# Patient Record
Sex: Male | Born: 1960 | Race: White | Hispanic: No | Marital: Married | Smoking: Never smoker
Health system: Southern US, Community
[De-identification: ages and names within clinical notes are randomized; demographics above are authoritative.]

## PROBLEM LIST (undated history)

## (undated) DIAGNOSIS — T884XXA Failed or difficult intubation, initial encounter: Secondary | ICD-10-CM

## (undated) DIAGNOSIS — L409 Psoriasis, unspecified: Secondary | ICD-10-CM

## (undated) DIAGNOSIS — D509 Iron deficiency anemia, unspecified: Secondary | ICD-10-CM

## (undated) DIAGNOSIS — R079 Chest pain, unspecified: Secondary | ICD-10-CM

## (undated) DIAGNOSIS — I10 Essential (primary) hypertension: Secondary | ICD-10-CM

## (undated) DIAGNOSIS — F1020 Alcohol dependence, uncomplicated: Secondary | ICD-10-CM

## (undated) DIAGNOSIS — M722 Plantar fascial fibromatosis: Secondary | ICD-10-CM

## (undated) DIAGNOSIS — F419 Anxiety disorder, unspecified: Secondary | ICD-10-CM

## (undated) DIAGNOSIS — K219 Gastro-esophageal reflux disease without esophagitis: Secondary | ICD-10-CM

## (undated) DIAGNOSIS — G4700 Insomnia, unspecified: Secondary | ICD-10-CM

## (undated) HISTORY — DX: Anxiety disorder, unspecified: F41.9

## (undated) HISTORY — DX: Insomnia, unspecified: G47.00

## (undated) HISTORY — PX: HEMORRHOID SURGERY: SHX153

## (undated) HISTORY — DX: Iron deficiency anemia, unspecified: D50.9

## (undated) HISTORY — DX: Plantar fascial fibromatosis: M72.2

## (undated) HISTORY — DX: Chest pain, unspecified: R07.9

## (undated) HISTORY — DX: Psoriasis, unspecified: L40.9

## (undated) HISTORY — DX: Alcohol dependence, uncomplicated: F10.20

## (undated) HISTORY — PX: OTHER SURGICAL HISTORY: SHX169

---

## 2001-07-04 ENCOUNTER — Ambulatory Visit (HOSPITAL_COMMUNITY): Admission: RE | Admit: 2001-07-04 | Discharge: 2001-07-04 | Payer: Self-pay | Admitting: Internal Medicine

## 2001-12-24 ENCOUNTER — Encounter: Payer: Self-pay | Admitting: Emergency Medicine

## 2001-12-24 ENCOUNTER — Emergency Department (HOSPITAL_COMMUNITY): Admission: EM | Admit: 2001-12-24 | Discharge: 2001-12-24 | Payer: Self-pay | Admitting: Emergency Medicine

## 2001-12-26 ENCOUNTER — Encounter: Payer: Self-pay | Admitting: Emergency Medicine

## 2001-12-26 ENCOUNTER — Ambulatory Visit (HOSPITAL_COMMUNITY): Admission: RE | Admit: 2001-12-26 | Discharge: 2001-12-26 | Payer: Self-pay | Admitting: Emergency Medicine

## 2002-11-16 HISTORY — PX: ESOPHAGOGASTRODUODENOSCOPY: SHX1529

## 2003-09-19 ENCOUNTER — Ambulatory Visit (HOSPITAL_COMMUNITY): Admission: RE | Admit: 2003-09-19 | Discharge: 2003-09-19 | Payer: Self-pay | Admitting: Internal Medicine

## 2010-06-06 ENCOUNTER — Ambulatory Visit (HOSPITAL_COMMUNITY): Admission: RE | Admit: 2010-06-06 | Discharge: 2010-06-06 | Payer: Self-pay | Admitting: Family Medicine

## 2010-06-10 ENCOUNTER — Ambulatory Visit (HOSPITAL_COMMUNITY): Admission: RE | Admit: 2010-06-10 | Discharge: 2010-06-10 | Payer: Self-pay | Admitting: Family Medicine

## 2011-04-03 NOTE — Op Note (Signed)
NAME:  Troy [name], Troy [name]                          ACCOUNT NO.:  [id number]   MEDICAL RECORD NO.:  [id number]                   PATIENT TYPE:  AMB   LOCATION:  DAY                                  FACILITY:  [facility]   PHYSICIAN:  Lionel December, M.D.                 DATE OF BIRTH:  [birth year]   DATE OF PROCEDURE:  09/19/2003  DATE OF DISCHARGE:  09/19/2003                                 OPERATIVE REPORT   PROCEDURE:  Esophagogastroduodenoscopy with esophageal dilatation.   ENDOSCOPIST:  Lionel December, M.D.   INDICATIONS:  This patient is a 50 year old Caucasian male with a 1-year  history of intermittent dysphagia primarily to solids and occasional  heartburn who is undergoing diagnostic/therapeutic procedure.  The procedure  and risks were reviewed with the patient and informed consent was obtained.   PREOPERATIVE MEDICATIONS:  Cetacaine spray for oropharyngeal topical  anesthesia, Demerol 50 mg IV and Versed 8 mg IV in divided dose.   FINDINGS:  Procedure performed in endoscopy suite.  The patient's vital  signs and O2 saturation were monitored during the procedure and remained  stable.  The patient was placed in the left lateral recumbent position and  Olympus videoscope was passed via the oropharynx without any difficulty into  the esophagus.   ESOPHAGUS:  Mucosa of the esophagus was normal; however, he had a soft  stricture at the esophagogastric junction with a single erosion.  There was  no hernia present.   STOMACH:  It was empty and distended very well with insufflation.  The folds  of the proximal stomach were normal.  Examination of the mucosa revealed  erosions at antrum.  Angularis, fundus, and cardia were examined by  retroflexing the scope and were normal.   DUODENUM:  Examination of the bulb and second part of the duodenum was  normal.   Endoscope was withdrawn  The esophagus was dilated by passing 56 [place]  Maloney dilator which resulted in a small linear tear at  the GE junction as  evidenced by passing the endoscope again.  The patient tolerated the  procedure well.   FINAL DIAGNOSES:  1. 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  The     stricture was dilated to 85 [place] by passing a dilator.  2. Erosive antral gastritis.   RECOMMENDATIONS:  1. Antireflux measures.  We will start him on omeprazole 20 mg p.o. q.a.m.  2. H. pylori will be checked today.      ___________________________________________                                            Lionel December, M.D.   NR/MEDQ  D:  10/03/2003  T:  10/04/2003  Job:  [id number]   cc:   Mila Homer. Sudie Bailey, M.D.  [phone]  [address]  [place], [place] [address]  Fax: [phone]

## 2012-11-16 DEATH — deceased

## 2014-06-01 ENCOUNTER — Encounter (HOSPITAL_COMMUNITY): Payer: Self-pay

## 2014-06-01 NOTE — Patient Instructions (Signed)
Troy [name]  06/01/2014   Your procedure is scheduled on:  06/07/2014  Report to [facility] at 8:00 AM.  Call this number if you have problems the morning of surgery: [phone]   Remember:   Do not eat food or drink liquids after midnight.   Take these medicines the morning of surgery with A SIP OF WATER: Lisinopril   Do not wear jewelry, make-up or nail polish.  Do not wear lotions, powders, or perfumes. You may wear deodorant.  Do not shave 48 hours prior to surgery. Men may shave face and neck.  Do not bring valuables to the hospital.  [facility] is not responsible for any belongings or valuables.               Contacts, dentures or bridgework may not be worn into surgery.  Leave suitcase in the car. After surgery it may be brought to your room.  For patients admitted to the hospital, discharge time is determined by your treatment team.               Patients discharged the day of surgery will not be allowed to drive home.  Name and phone number of your driver:   Special Instructions: Shower using CHG 2 nights before surgery and the night before surgery.  If you shower the day of surgery use CHG.  Use special wash - you have one bottle of CHG for all showers.  You should use approximately 1/3 of the bottle for each shower.   Please read over the following fact sheets that you were given: Surgical Site Infection Prevention and Anesthesia Post-op Instructions   PATIENT INSTRUCTIONS POST-ANESTHESIA  IMMEDIATELY FOLLOWING SURGERY:  Do not drive or operate machinery for the first twenty four hours after surgery.  Do not make any important decisions for twenty four hours after surgery or while taking narcotic pain medications or sedatives.  If you develop intractable nausea and vomiting or a severe headache please notify your doctor immediately.  FOLLOW-UP:  Please make an appointment with your surgeon as instructed. You do not need to follow up with anesthesia unless specifically  instructed to do so.  WOUND CARE INSTRUCTIONS (if applicable):  Keep a dry clean dressing on the anesthesia/puncture wound site if there is drainage.  Once the wound has quit draining you may leave it open to air.  Generally you should leave the bandage intact for twenty four hours unless there is drainage.  If the epidural site drains for more than 36-48 hours please call the anesthesia department.  QUESTIONS?:  Please feel free to call your physician or the hospital operator if you have any questions, and they will be happy to assist you.      Laparoscopic Ventral Hernia Repair Laparoscopic ventral hernia repairis a surgery to fix a ventral hernia. Aventral hernia, also called an incisional hernia, is a bulge of body tissue or intestines that pushes through the front part of the abdomen. This can happen if the connective tissue covering the muscles over the abdomen has a weak spot or is torn because of a surgical cut (incision) from a previous surgery. Laparoscopic ventral hernia repair is often done soon after diagnosis to stop the hernia from getting bigger, becoming uncomfortable, or becoming an emergency. This surgery usually takes about 2 hours, but the time can vary greatly. LET [facility] CARE PROVIDER KNOW ABOUT:  Any allergies you have.  All medicines you are taking, including steroids, vitamins, herbs, eye drops,  creams, and over-the-counter medicines.  Previous problems you or members of your family have had with the use of anesthetics.  Any blood disorders you have.  Previous surgeries you have had.  Medical conditions you have. RISKS AND COMPLICATIONS  Generally, laparoscopic ventral hernia repair is a safe procedure. However, as with any surgical procedure, problems can occur. Possible problems include:  Bleeding.  Trouble passing urine or having a bowel movement after the surgery.  Infection.  Pneumonia.  Blood clots.  Pain in the area of the hernia.  A bulge  in the area of the hernia that may be caused by a collection of fluid.  Injury to intestines or other structures in the abdomen.  Return of the hernia after surgery. In some cases, your health care provider may need to stop the laparoscopic procedure and do regular, open surgery. This may be necessary for very difficult hernias, when organs are hard to see, or when bleeding problems occur during surgery. BEFORE THE PROCEDURE   You may need to have blood tests, urine tests, a chest X-ray, or an electrocardiogram done before the day of the surgery.  Ask your health care provider about changing or stopping your regular medicines. This is especially important if you are taking diabetes medicines or blood thinners.  You may need to wash with a special type of germ-killing soap.  Do not eat or drink anything after midnight the night before the procedure or as directed by your health care provider.  Make plans to have someone drive you home after the procedure. PROCEDURE   Small monitors will be put on your body. They are used to check your heart, blood pressure, and oxygen level.  An IV access tube will be put into a vein in your hand or arm. Fluids and medicine will flow directly into your body through the IV tube.  You will be given medicine that makes you go to sleep (general anesthetic).  Your abdomen will be cleaned with a special soap to kill any germs on your skin.  Once you are asleep, several small incisions will be made in your abdomen.  The large space in your abdomen will be filled with air so that it expands. This gives your health care provider more room and a better view.  A thin, lighted tube with a tiny camera on the end (laparoscope) is put through a small incision in your abdomen. The camera on the laparoscope sends a picture to a TV screen in the operating room. This gives your health care provider a good view inside your abdomen.  Hollow tubes are put through the other  small incisions in your abdomen. The tools needed for the procedure are put through these tubes.  Your health care provider puts the tissue or intestines that formed the hernia back in place.  A screen-like patch (mesh) is used to close the hernia. This helps make the area stronger. Stitches, tacks, or staples are used to keep the mesh in place.  Medicine and a bandage (dressing) or skin glue will be put over the incisions. AFTER THE PROCEDURE   You will stay in a recovery area until the anesthetic wears off. Your blood pressure and pulse will be checked often.  You may be able to go home the same day or may need to stay in the hospital for 1-2 days after surgery. Your health care provider will decide when you can go home.  You may feel some pain. You may be given medicine  for pain.  You will be urged to do breathing exercises that involve taking deep breaths. This helps prevent a lung infection after a surgery.  You may have to wear compression stockings while you are in the hospital. These stockings help keep blood clots from forming in your legs. Document Released: 10/19/2012 Document Revised: 11/07/2013 Document Reviewed: 10/19/2012 [facility] Patient Information 2015 [employer], [place]. This information is not intended to replace advice given to you by your health care provider. Make sure you discuss any questions you have with your health care provider.

## 2014-06-02 NOTE — Consult Note (Signed)
NAMBenjamine [name]:  [name], Troy [name]                ACCOUNT NO.:  [id number]  MEDICAL RECORD NO.:  [id number]  LOCATION:  PERIO                         FACILITY:  [facility]  PHYSICIAN:  Troy BarthelWilliam Kendyl [name], M.D. DATE OF BIRTH:  1961-08-10  DATE OF CONSULTATION:  06/01/2014 DATE OF DISCHARGE:                                CONSULTATION   NOTE:  Surgery was asked to see this 53 year old white male for an umbilical hernia.  He has had this for approximately 2 months.  This has caused him increasing discomfort.  He was referred from Dr. Michelle NasutiKnowlton's office.  Clinically, this is a reducible moderate-sized umbilical hernia.  PAST MEDICAL HISTORY:  Positive for hypertension.  PAST SURGICAL HISTORY:  He has had right ankle surgery about 35 years ago.  MEDICATIONS:  See medication list.  ALLERGIES:  He has no known allergies.  SOCIAL HISTORY:  He is a nonsmoker.  He does socially imbibe alcohol.  PHYSICAL EXAMINATION:  GENERAL:  He is in no acute distress. VITAL SIGNS:  He is 6 feet 2 inches, weighs 213 pounds, temperature is 97, pulse is 80 and regular, respirations 12, blood pressure 140/100. HEENT:  Head is normocephalic.  Eyes, extraocular movements are intact. Pupils are round, and reactive to light and accommodation.  There was no conjunctival pallor or scleral injection.  The sclera has a normal tincture.  Nose and oral mucosa are moist.  There is no jugular vein distention, thyromegaly, or cervical adenopathy. CHEST:  Clear both anterior and posterior auscultation. HEART:  Regular rhythm. ABDOMEN:  Soft.  The patient has umbilical hernia that is reducible. RECTAL:  An anoscopy was done.  He has internal hemorrhoids and some skin tags that he was worried might be external hemorrhoid but in fact are skin tags and no surgeries advised on these.  We will plan an Ultroid treatment on his internal hemorrhoids as office procedure at a later time.  His prostate is enlarged but smoothed. EXTREMITIES:  Within  normal limits.  REVIEW OF SYSTEMS:  NEURO:  No migraine, seizures or lateralizing neurological findings.  ENDOCRINE:  No history of diabetes, thyroid disease, or adrenal problems.  CARDIOPULMONARY:  The  patient has a history of hypertension.  MUSCULOSKELETAL:  Grossly within normal limits except in his 20s he had a right ankle surgery.  GI:  No past history of hepatitis.  No symptoms of diarrhea or bright red rectal bleeding, although at times he has had some bleeding from his hemorrhoids.  He does have recurrent bouts sometimes of constipation.  No history of black tarry stools.  No history of irritable bowel syndrome or inflammatory bowel disease, no unexplained weight loss.  The patient had a colonoscopy in 2007.  GU:  No history of frequency, dysuria, or history of kidney stones.  Review of history and physical therefore [name] is a 53 year old white male who has an umbilical hernia which we will repair electively. We discussed the surgery in detail discussing complications, not limited to, but including bleeding, infection, and recurrence, and the possibility that a mesh prosthesis might be utilized.  Informed consent was obtained.  We also discussed his desire to have his internal hemorrhoids taken care of and  this I assured him could be done as an office procedure and I gave him some medical treatment for these in the interim.  We discussed surgery in detail and informed consent was obtained.     Troy [name], M.D.     WB/MEDQ  D:  06/01/2014  T:  06/02/2014  Job:  [id number]  cc:   Troy [name]. Troy [name], M.D. Fax: [phone]

## 2014-06-04 ENCOUNTER — Encounter (HOSPITAL_COMMUNITY): Payer: Self-pay | Admitting: Pharmacy Technician

## 2014-06-04 ENCOUNTER — Other Ambulatory Visit: Payer: Self-pay

## 2014-06-04 ENCOUNTER — Encounter (HOSPITAL_COMMUNITY): Payer: Self-pay

## 2014-06-04 ENCOUNTER — Encounter (HOSPITAL_COMMUNITY)
Admission: RE | Admit: 2014-06-04 | Discharge: 2014-06-04 | Disposition: A | Discharge status: Home / Self Care | Payer: 59 | Source: Ambulatory Visit | Attending: General Surgery | Admitting: General Surgery

## 2014-06-04 DIAGNOSIS — K420 Umbilical hernia with obstruction, without gangrene: Secondary | ICD-10-CM | POA: Diagnosis present

## 2014-06-04 DIAGNOSIS — K219 Gastro-esophageal reflux disease without esophagitis: Secondary | ICD-10-CM | POA: Diagnosis not present

## 2014-06-04 DIAGNOSIS — Z01812 Encounter for preprocedural laboratory examination: Secondary | ICD-10-CM | POA: Diagnosis not present

## 2014-06-04 DIAGNOSIS — Z01810 Encounter for preprocedural cardiovascular examination: Secondary | ICD-10-CM | POA: Diagnosis not present

## 2014-06-04 DIAGNOSIS — I10 Essential (primary) hypertension: Secondary | ICD-10-CM | POA: Diagnosis not present

## 2014-06-04 HISTORY — DX: Essential (primary) hypertension: I10

## 2014-06-04 HISTORY — DX: Gastro-esophageal reflux disease without esophagitis: K21.9

## 2014-06-04 LAB — BASIC METABOLIC PANEL
Anion gap: 13 (ref 5–15)
BUN: 16 mg/dL (ref 6–23)
CHLORIDE: 99 meq/L (ref 96–112)
CO2: 28 meq/L (ref 19–32)
Calcium: 10.2 mg/dL (ref 8.4–10.5)
Creatinine, Ser: 0.97 mg/dL (ref 0.50–1.35)
GFR calc Af Amer: >90 mL/min (ref >90)
GFR calc non Af Amer: >90 mL/min (ref >90)
Glucose, Bld: 90 mg/dL (ref 70–99)
POTASSIUM: 4.9 meq/L (ref 3.7–5.3)
Sodium: 140 mEq/L (ref 137–147)

## 2014-06-04 LAB — CBC
HCT: 44 % (ref 39.0–52.0)
HEMOGLOBIN: 14.4 g/dL (ref 13.0–17.0)
MCH: 29.4 pg (ref 26.0–34.0)
MCHC: 32.7 g/dL (ref 30.0–36.0)
MCV: 90 fL (ref 78.0–100.0)
Platelets: 309 10*3/uL (ref 150–400)
RBC: 4.89 MIL/uL (ref 4.22–5.81)
RDW: 13.1 % (ref 11.5–15.5)
WBC: 4.5 10*3/uL (ref 4.0–10.5)

## 2014-06-04 LAB — DIFFERENTIAL
Basophils Absolute: 0 10*3/uL (ref 0.0–0.1)
Basophils Relative: 1 % (ref 0–1)
Eosinophils Absolute: 0.2 10*3/uL (ref 0.0–0.7)
Eosinophils Relative: 3 % (ref 0–5)
LYMPHS ABS: 1.5 10*3/uL (ref 0.7–4.0)
Lymphocytes Relative: 33 % (ref 12–46)
Monocytes Absolute: 0.3 10*3/uL (ref 0.1–1.0)
Monocytes Relative: 7 % (ref 3–12)
NEUTROS PCT: 56 % (ref 43–77)
Neutro Abs: 2.6 10*3/uL (ref 1.7–7.7)

## 2014-06-04 NOTE — Pre-Procedure Instructions (Signed)
Pt. Given info for MyChart. To be set up at home. 

## 2014-06-07 ENCOUNTER — Ambulatory Visit (HOSPITAL_COMMUNITY): Payer: 59 | Admitting: Anesthesiology

## 2014-06-07 ENCOUNTER — Ambulatory Visit (HOSPITAL_COMMUNITY)
Admission: RE | Admit: 2014-06-07 | Discharge: 2014-06-07 | Disposition: A | Discharge status: Home / Self Care | Payer: 59 | Source: Ambulatory Visit | Attending: General Surgery | Admitting: General Surgery

## 2014-06-07 ENCOUNTER — Encounter (HOSPITAL_COMMUNITY)
Admission: RE | Disposition: A | Discharge status: Home / Self Care | Payer: Self-pay | Source: Ambulatory Visit | Attending: General Surgery

## 2014-06-07 ENCOUNTER — Encounter (HOSPITAL_COMMUNITY): Payer: 59 | Admitting: Anesthesiology

## 2014-06-07 DIAGNOSIS — Z01812 Encounter for preprocedural laboratory examination: Secondary | ICD-10-CM | POA: Insufficient documentation

## 2014-06-07 DIAGNOSIS — K420 Umbilical hernia with obstruction, without gangrene: Secondary | ICD-10-CM | POA: Diagnosis not present

## 2014-06-07 DIAGNOSIS — I10 Essential (primary) hypertension: Secondary | ICD-10-CM | POA: Insufficient documentation

## 2014-06-07 DIAGNOSIS — K219 Gastro-esophageal reflux disease without esophagitis: Secondary | ICD-10-CM | POA: Insufficient documentation

## 2014-06-07 DIAGNOSIS — Z01810 Encounter for preprocedural cardiovascular examination: Secondary | ICD-10-CM | POA: Insufficient documentation

## 2014-06-07 HISTORY — PX: UMBILICAL HERNIA REPAIR: SHX196

## 2014-06-07 SURGERY — REPAIR, HERNIA, UMBILICAL, ADULT
Anesthesia: General | Site: Abdomen

## 2014-06-07 MED ORDER — OXYCODONE-ACETAMINOPHEN 10-325 MG PO TABS: 1.0000 | ORAL_TABLET | ORAL | Status: DC | PRN

## 2014-06-07 MED ORDER — FENTANYL CITRATE 0.05 MG/ML IJ SOLN
INTRAMUSCULAR | Status: AC
  Filled 2014-06-07: qty 2

## 2014-06-07 MED ORDER — MIDAZOLAM HCL 5 MG/5ML IJ SOLN
INTRAMUSCULAR | Status: DC | PRN
  Administered 2014-06-07: 2 mg via INTRAVENOUS

## 2014-06-07 MED ORDER — FENTANYL CITRATE 0.05 MG/ML IJ SOLN
25.0000 ug | INTRAMUSCULAR | Status: DC | PRN
  Administered 2014-06-07 (×2): 50 ug via INTRAVENOUS

## 2014-06-07 MED ORDER — GLYCOPYRROLATE 0.2 MG/ML IJ SOLN
INTRAMUSCULAR | Status: AC
  Filled 2014-06-07: qty 2

## 2014-06-07 MED ORDER — MORPHINE SULFATE 2 MG/ML IJ SOLN: 1.0000 mg | INTRAMUSCULAR | Status: DC | PRN

## 2014-06-07 MED ORDER — ONDANSETRON HCL 4 MG PO TABS: 4.0000 mg | ORAL_TABLET | Freq: Four times a day (QID) | ORAL | Status: DC | PRN

## 2014-06-07 MED ORDER — PROPOFOL 10 MG/ML IV BOLUS
INTRAVENOUS | Status: AC
  Filled 2014-06-07: qty 20

## 2014-06-07 MED ORDER — GLYCOPYRROLATE 0.2 MG/ML IJ SOLN
INTRAMUSCULAR | Status: DC | PRN
  Administered 2014-06-07: 0.4 mg via INTRAVENOUS

## 2014-06-07 MED ORDER — ONDANSETRON HCL 4 MG/2ML IJ SOLN: 4.0000 mg | Freq: Four times a day (QID) | INTRAMUSCULAR | Status: DC | PRN

## 2014-06-07 MED ORDER — LIDOCAINE HCL (PF) 1 % IJ SOLN
INTRAMUSCULAR | Status: AC
  Filled 2014-06-07: qty 5

## 2014-06-07 MED ORDER — ONDANSETRON HCL 4 MG/2ML IJ SOLN: 4.0000 mg | Freq: Once | INTRAMUSCULAR | Status: DC | PRN

## 2014-06-07 MED ORDER — FENTANYL CITRATE 0.05 MG/ML IJ SOLN
INTRAMUSCULAR | Status: DC | PRN
  Administered 2014-06-07: 50 ug via INTRAVENOUS
  Administered 2014-06-07: 100 ug via INTRAVENOUS
  Administered 2014-06-07: 50 ug via INTRAVENOUS

## 2014-06-07 MED ORDER — PROPOFOL 10 MG/ML IV BOLUS
INTRAVENOUS | Status: DC | PRN
  Administered 2014-06-07: 160 mg via INTRAVENOUS

## 2014-06-07 MED ORDER — MIDAZOLAM HCL 2 MG/2ML IJ SOLN
1.0000 mg | INTRAMUSCULAR | Status: DC | PRN
  Administered 2014-06-07: 2 mg via INTRAVENOUS

## 2014-06-07 MED ORDER — BACITRACIN 50000 UNITS IM SOLR
INTRAMUSCULAR | Status: AC
  Filled 2014-06-07: qty 1

## 2014-06-07 MED ORDER — CEFAZOLIN SODIUM-DEXTROSE 2-3 GM-% IV SOLR
2.0000 g | Freq: Once | INTRAVENOUS | Status: AC
  Administered 2014-06-07: 2 g via INTRAVENOUS

## 2014-06-07 MED ORDER — LACTATED RINGERS IV SOLN
INTRAVENOUS | Status: DC
  Administered 2014-06-07: 10:00:00 via INTRAVENOUS

## 2014-06-07 MED ORDER — BACITRACIN-NEOMYCIN-POLYMYXIN 400-5-5000 EX OINT
TOPICAL_OINTMENT | CUTANEOUS | Status: AC
  Filled 2014-06-07: qty 1

## 2014-06-07 MED ORDER — LIDOCAINE HCL 1 % IJ SOLN
INTRAMUSCULAR | Status: DC | PRN
  Administered 2014-06-07: 50 mg via INTRADERMAL

## 2014-06-07 MED ORDER — NEOSTIGMINE METHYLSULFATE 10 MG/10ML IV SOLN
INTRAVENOUS | Status: DC | PRN
  Administered 2014-06-07 (×2): 2 mg via INTRAVENOUS

## 2014-06-07 MED ORDER — ROCURONIUM BROMIDE 50 MG/5ML IV SOLN
INTRAVENOUS | Status: AC
  Filled 2014-06-07: qty 1

## 2014-06-07 MED ORDER — SODIUM CHLORIDE 0.9 % IR SOLN
Status: DC | PRN
  Administered 2014-06-07: 1000 mL

## 2014-06-07 MED ORDER — STERILE WATER FOR IRRIGATION IR SOLN
Status: DC | PRN
  Administered 2014-06-07: 2000 mL

## 2014-06-07 MED ORDER — LISINOPRIL 10 MG PO TABS: 20.0000 mg | ORAL_TABLET | Freq: Every day | ORAL | Status: DC

## 2014-06-07 MED ORDER — BUPIVACAINE HCL (PF) 0.5 % IJ SOLN
INTRAMUSCULAR | Status: AC
  Filled 2014-06-07: qty 30

## 2014-06-07 MED ORDER — MIDAZOLAM HCL 2 MG/2ML IJ SOLN
INTRAMUSCULAR | Status: AC
  Filled 2014-06-07: qty 2

## 2014-06-07 MED ORDER — CEFAZOLIN SODIUM-DEXTROSE 2-3 GM-% IV SOLR
INTRAVENOUS | Status: AC
  Filled 2014-06-07: qty 50

## 2014-06-07 MED ORDER — ROCURONIUM BROMIDE 100 MG/10ML IV SOLN
INTRAVENOUS | Status: DC | PRN
  Administered 2014-06-07: 40 mg via INTRAVENOUS
  Administered 2014-06-07: 10 mg via INTRAVENOUS

## 2014-06-07 MED ORDER — POTASSIUM CHLORIDE IN NACL 20-0.9 MEQ/L-% IV SOLN
INTRAVENOUS | Status: DC
  Administered 2014-06-07: 12:00:00 via INTRAVENOUS

## 2014-06-07 MED ORDER — ONDANSETRON HCL 4 MG/2ML IJ SOLN
4.0000 mg | Freq: Once | INTRAMUSCULAR | Status: AC
  Administered 2014-06-07: 4 mg via INTRAVENOUS

## 2014-06-07 MED ORDER — ONDANSETRON HCL 4 MG/2ML IJ SOLN
INTRAMUSCULAR | Status: AC
  Filled 2014-06-07: qty 2

## 2014-06-07 SURGICAL SUPPLY — 46 items
ATTRACTOMAT 16X20 MAGNETIC DRP (DRAPES) ×3 IMPLANT
BAG HAMPER (MISCELLANEOUS) ×3 IMPLANT
CLOTH BEACON ORANGE TIMEOUT ST (SAFETY) ×3 IMPLANT
COVER LIGHT HANDLE STERIS (MISCELLANEOUS) ×6 IMPLANT
DECANTER SPIKE VIAL GLASS SM (MISCELLANEOUS) ×3 IMPLANT
ELECT REM PT RETURN 9FT ADLT (ELECTROSURGICAL) ×3
ELECTRODE REM PT RTRN 9FT ADLT (ELECTROSURGICAL) ×2 IMPLANT
FORMALIN 10 PREFIL 120ML (MISCELLANEOUS) ×3 IMPLANT
GAUZE SPONGE 4X4 12PLY STRL (GAUZE/BANDAGES/DRESSINGS) ×3 IMPLANT
GLOVE BIOGEL PI IND STRL 7.0 (GLOVE) ×2 IMPLANT
GLOVE BIOGEL PI IND STRL 7.5 (GLOVE) ×2 IMPLANT
GLOVE BIOGEL PI INDICATOR 7.0 (GLOVE) ×1
GLOVE BIOGEL PI INDICATOR 7.5 (GLOVE) ×1
GLOVE ECLIPSE 6.5 STRL STRAW (GLOVE) ×3 IMPLANT
GLOVE EXAM NITRILE PF LG BLUE (GLOVE) ×3 IMPLANT
GLOVE SKINSENSE NS SZ7.0 (GLOVE) ×1
GLOVE SKINSENSE STRL SZ7.0 (GLOVE) ×2 IMPLANT
GLOVE SURG SS PI 7.5 STRL IVOR (GLOVE) ×3 IMPLANT
GOWN STRL REUS W/TWL LRG LVL3 (GOWN DISPOSABLE) ×9 IMPLANT
INST SET MAJOR GENERAL (KITS) ×3 IMPLANT
KIT ROOM TURNOVER APOR (KITS) ×3 IMPLANT
MANIFOLD NEPTUNE II (INSTRUMENTS) ×3 IMPLANT
NS IRRIG 1000ML POUR BTL (IV SOLUTION) ×3 IMPLANT
PACK ABDOMINAL MAJOR (CUSTOM PROCEDURE TRAY) ×3 IMPLANT
PAD ARMBOARD 7.5X6 YLW CONV (MISCELLANEOUS) ×3 IMPLANT
RETAINER VISCERA MED (MISCELLANEOUS) IMPLANT
SET BASIN LINEN APH (SET/KITS/TRAYS/PACK) ×3 IMPLANT
SOL PREP PROV IODINE SCRUB 4OZ (MISCELLANEOUS) ×3 IMPLANT
SPONGE GAUZE 2X2 8PLY STRL LF (GAUZE/BANDAGES/DRESSINGS) ×3 IMPLANT
SPONGE GAUZE 4X4 12PLY (GAUZE/BANDAGES/DRESSINGS) ×3 IMPLANT
SPONGE INTESTINAL PEANUT (DISPOSABLE) ×3 IMPLANT
STAPLER VISISTAT 35W (STAPLE) ×3 IMPLANT
SUT NOVA NAB GS-21 1 T12 (SUTURE) IMPLANT
SUT PROLENE 0 CT 1 30 (SUTURE) ×6 IMPLANT
SUT PROLENE 0 CT 1 CR/8 (SUTURE) IMPLANT
SUT SILK 2 0 (SUTURE) ×1
SUT SILK 2-0 18XBRD TIE 12 (SUTURE) ×2 IMPLANT
SUT VIC AB 3-0 SH 27 (SUTURE)
SUT VIC AB 3-0 SH 27X BRD (SUTURE) IMPLANT
SUT VICRYL AB 3 0 TIES (SUTURE) ×3 IMPLANT
SUT VICRYL AB 3-0 BRD CT 36IN (SUTURE) ×3 IMPLANT
SYR CONTROL 10ML LL (SYRINGE) ×3 IMPLANT
TAPE CLOTH SURG 4X10 WHT LF (GAUZE/BANDAGES/DRESSINGS) ×3 IMPLANT
TOWEL OR 17X26 4PK STRL BLUE (TOWEL DISPOSABLE) IMPLANT
TRAY FOLEY CATH 16FR SILVER (SET/KITS/TRAYS/PACK) ×3 IMPLANT
WATER STERILE IRR 1000ML POUR (IV SOLUTION) ×6 IMPLANT

## 2014-06-07 NOTE — Progress Notes (Signed)
Patient ambulated in hallway without difficulty.

## 2014-06-07 NOTE — Progress Notes (Signed)
Post OP Check  Filed Vitals:   06/07/14 1330  BP: 121/62  Pulse: 55  Temp: 97.8 F (36.6 C)  Resp: 18    Pt awake and alert.  Wound clean and dry and abdomen soft.  Minimal complaints of pain.  Pt doing very well after surgery will discharge after he has voided.  Discharge dict. Note #: [id number].

## 2014-06-07 NOTE — Anesthesia Postprocedure Evaluation (Signed)
  Anesthesia Post-op Note  Patient: Troy [name]  Procedure(s) Performed: Procedure(s): HERNIA REPAIR VENTRAL ADULT (N/A) HERNIA REPAIR UMBILICAL ADULT (N/A)  Patient Location: PACU  Anesthesia Type:General  Level of Consciousness: awake, alert , oriented and patient cooperative  Airway and Oxygen Therapy: Patient Spontanous Breathing  Post-op Pain: 3 /10, mild  Post-op Assessment: Post-op Vital signs reviewed, Patient's Cardiovascular Status Stable, Respiratory Function Stable, Patent Airway, No signs of Nausea or vomiting and Pain level controlled  Post-op Vital Signs: Reviewed and stable  Last Vitals:  Filed Vitals:   06/07/14 0940  BP: 149/99  Pulse:   Temp:   Resp: 14    Complications: No apparent anesthesia complications

## 2014-06-07 NOTE — Op Note (Signed)
NAMBenjamine [name]:  Troy [name], Troy [name]                ACCOUNT NO.:  [id number]  MEDICAL RECORD NO.:  [id number]  LOCATION:  APPO                          FACILITY:  [facility]  PHYSICIAN:  Barbaraann BarthelWilliam Dougles Kimmey, M.D. DATE OF BIRTH:  03-Aug-1961  DATE OF PROCEDURE:  06/07/2014 DATE OF DISCHARGE:                              OPERATIVE REPORT   DIAGNOSIS:  Umbilical hernia (incarcerated).  SPECIMEN:  Umbilical hernia sac with omentum.  WOUND CLASSIFICATION:  Clean.  GROSS OPERATIVE FINDINGS:  The patient had an incarcerated omental fat within the umbilical hernia sac.  This was not infarcted and did not contain any bowel.  TECHNIQUE:  The patient was placed in a supine position.  After the adequate administration of general anesthesia, the abdomen was prepped with Betadine solution and draped in usual manner.  An umbilical incision was carried out over the inferior aspect of the umbilicus.  The hernia sac was dissected free from the adhered skin of the umbilicus. The defect was then amputated clamping the incarcerated omentum and ligating this with 2-0 silk after opening the hernia sac and making sure there was no problem with any bowel.  The actual defect in the abdominal wall was approximately the size of a nickel and we elected to close that without any mesh using 0 Prolene sutures two of them in a figure-of- eight fashion.  The subcu was irrigated.  We tacked the umbilicus skin to the fascia in order to restore the concave appearance of the umbilicus and then closed the skin with a stapling device.  No drains were placed.  Prior to closure, all sponge, needle, and instrument counts were found to be correct.  Estimated blood loss was minimal.  He received approximately 1400 mL of crystalloids intraoperatively.  No drains were placed.  There were no complications.     Barbaraann BarthelWilliam Derenda Giddings, M.D.     WB/MEDQ  D:  06/07/2014  T:  06/07/2014  Job:  [id number]  cc:   Mila HomerStephen D. Sudie BaileyKnowlton, M.D. Fax:  [phone]

## 2014-06-07 NOTE — Discharge Summary (Signed)
[name], [name]                ACCOUNT NO.:  [id number]  MEDICAL RECORD NO.:  [id number]  LOCATION:  A337                          FACILITY:  [facility]  PHYSICIAN:  Barbaraann BarthelWilliam Deagen Krass, M.D. DATE OF BIRTH:  [birth year]  DATE OF ADMISSION:  06/07/2014 DATE OF DISCHARGE:  07/23/2015LH                              DISCHARGE SUMMARY   NOTE:  This is a 53 year old white male who was admitted via the outpatient department for repair of an incarcerated umbilical hernia. This was done uneventfully and the patient postoperatively had minimal discomfort.  His wounds were clean and dry, and I feel we can follow him as an outpatient after this period of observation postoperatively.  He has been told to follow up with us in the morning and discharge instructions were given in detail and we will discharge him as soon as he is able to void without any discomfort.  He is told to contact me should he have any problems or to go to the emergency room.     Barbaraann BarthelWilliam Hang Ammon, M.D.     WB/MEDQ  D:  06/07/2014  T:  06/07/2014  Job:  [id number]  cc:   Mila HomerStephen D. Sudie BaileyKnowlton, M.D. Fax: [phone]

## 2014-06-07 NOTE — Progress Notes (Signed)
[age] yr. Old W. Male for elective repair of umbilical hernia.  Procedure and risks explained and informed consent obtained.  Filed Vitals:   06/07/14 0830  BP: 165/104  Pulse: 69  Temp: 98.2 F (36.8 C)  Resp: 18   No clinical change since H&P, dict # [id number].

## 2014-06-07 NOTE — Progress Notes (Signed)
Patient discharged home with family ,instructions were given on follow up visits,medications,and diet,patient,and wife both verbalized understanding.Prescriptions sent with patient. Carenotes given on  postoperative care for Hernia repair. Accompanied by staff to an awaiting vehicle.

## 2014-06-07 NOTE — Transfer of Care (Signed)
Immediate Anesthesia Transfer of Care Note  Patient: Troy [name]  Procedure(s) Performed: Procedure(s): HERNIA REPAIR VENTRAL ADULT (N/A) HERNIA REPAIR UMBILICAL ADULT (N/A)  Patient Location: PACU  Anesthesia Type:General  Level of Consciousness: awake and patient cooperative  Airway & Oxygen Therapy: Patient Spontanous Breathing and Patient connected to face mask oxygen  Post-op Assessment: Report given to PACU RN, Post -op Vital signs reviewed and stable and Patient moving all extremities  Post vital signs: Reviewed and stable  Complications: No apparent anesthesia complications

## 2014-06-07 NOTE — Brief Op Note (Signed)
06/07/2014  11:00 AM  PATIENT:  Troy [name]  53 y.o. male  PRE-OPERATIVE DIAGNOSIS:  ventral hernia   POST-OPERATIVE DIAGNOSIS:  ventral hernia  PROCEDURE:  Procedure(s): HERNIA REPAIR VENTRAL ADULT (N/A) HERNIA REPAIR UMBILICAL ADULT (N/A)  SURGEON:  Surgeon(s) and Role:    * Marlane HatcherWilliam S Edge Mauger, MD - Primary  PHYSICIAN ASSISTANT:   ASSISTANTS: none   ANESTHESIA:   general  EBL:  Total I/O In: 600 [I.V.:600] Out: -   BLOOD ADMINISTERED:none  DRAINS: none   LOCAL MEDICATIONS USED:  MARCAINE 0.5%  ~ 10 cc.    SPECIMEN:  Source of Specimen:  umbilical hernia sac and incarcerated omentum.  DISPOSITION OF SPECIMEN:  PATHOLOGY  COUNTS:  YES  TOURNIQUET:  * No tourniquets in log *  DICTATION: .Other Dictation: Dictation Number OR dictation # [id number].  PLAN OF CARE: Admit for overnight observation  PATIENT DISPOSITION:  PACU - hemodynamically stable.   Delay start of Pharmacological VTE agent (>24hrs) due to surgical blood loss or risk of bleeding: not applicable

## 2014-06-07 NOTE — Anesthesia Preprocedure Evaluation (Addendum)
Anesthesia Evaluation  Patient identified by MRN, date of birth, ID band Patient awake    Reviewed: Allergy & Precautions, H&P , NPO status , Patient's Chart, lab work & pertinent test results  Airway Mallampati: II TM Distance: >3 FB     Dental   Pulmonary neg pulmonary ROS,  breath sounds clear to auscultation        Cardiovascular hypertension, Pt. on medications Rhythm:Regular     Neuro/Psych    GI/Hepatic GERD-  Medicated and Controlled,  Endo/Other    Renal/GU      Musculoskeletal   Abdominal   Peds  Hematology   Anesthesia Other Findings   Reproductive/Obstetrics                         Anesthesia Physical Anesthesia Plan  ASA: II  Anesthesia Plan: General   Post-op Pain Management:    Induction: Intravenous, Rapid sequence and Cricoid pressure planned  Airway Management Planned: Oral ETT  Additional Equipment:   Intra-op Plan:   Post-operative Plan: Extubation in OR  Informed Consent: I have reviewed the patients History and Physical, chart, labs and discussed the procedure including the risks, benefits and alternatives for the proposed anesthesia with the patient or authorized representative who has indicated his/her understanding and acceptance.     Plan Discussed with:   Anesthesia Plan Comments:         Anesthesia Quick Evaluation

## 2014-06-07 NOTE — Plan of Care (Signed)
Problem: Phase II Progression Outcomes Goal: Progress activity as tolerated unless otherwise ordered Outcome: Completed/Met Date Met:  06/07/14 Ambulated in hallway without difficulty.

## 2014-06-07 NOTE — Anesthesia Procedure Notes (Signed)
Procedure Name: Intubation Date/Time: 06/07/2014 9:58 AM Performed by: Despina HiddenIDACAVAGE, Troy [name] Pre-anesthesia Checklist: Emergency Drugs available, Suction available, Patient being monitored and Patient identified Patient Re-evaluated:Patient Re-evaluated prior to inductionOxygen Delivery Method: Circle system utilized Preoxygenation: Pre-oxygenation with 100% oxygen Intubation Type: IV induction and Cricoid Pressure applied Ventilation: Mask ventilation without difficulty and Oral airway inserted - appropriate to patient size Laryngoscope Size: Mac and 3 Grade View: Grade II Tube type: Oral Number of attempts: 1 Airway Equipment and Method: Stylet Placement Confirmation: ETT inserted through vocal cords under direct vision,  positive ETCO2 and breath sounds checked- equal and bilateral Secured at: 22 cm Tube secured with: Tape Dental Injury: Teeth and Oropharynx as per pre-operative assessment

## 2014-06-08 ENCOUNTER — Encounter (HOSPITAL_COMMUNITY): Payer: Self-pay | Admitting: General Surgery

## 2014-06-08 MED ORDER — TRIPLE ANTIBIOTIC 3.5-400-5000 EX OINT
TOPICAL_OINTMENT | CUTANEOUS | Status: DC | PRN
  Administered 2014-06-07: 1 via TOPICAL

## 2014-06-08 MED ORDER — BUPIVACAINE HCL (PF) 0.5 % IJ SOLN
INTRAMUSCULAR | Status: DC | PRN
  Administered 2014-06-07: 7 mL

## 2014-06-18 NOTE — Progress Notes (Signed)
UR review complete.  

## 2016-11-05 ENCOUNTER — Encounter: Payer: Self-pay | Admitting: *Deleted

## 2016-11-06 ENCOUNTER — Ambulatory Visit (INDEPENDENT_AMBULATORY_CARE_PROVIDER_SITE_OTHER): Payer: Managed Care, Other (non HMO) | Admitting: Cardiology

## 2016-11-06 ENCOUNTER — Encounter: Payer: Self-pay | Admitting: Cardiology

## 2016-11-06 ENCOUNTER — Encounter: Payer: Self-pay | Admitting: *Deleted

## 2016-11-06 VITALS — BP 128/75 | HR 81 | Ht 72.0 in | Wt 202.0 lb

## 2016-11-06 DIAGNOSIS — R0789 Other chest pain: Secondary | ICD-10-CM

## 2016-11-06 DIAGNOSIS — I10 Essential (primary) hypertension: Secondary | ICD-10-CM

## 2016-11-06 NOTE — Progress Notes (Signed)
Clinical Summary [name] is a 55 y.o.male seen today as new consultation, he is referred by Dr Troy [name].   1. Chest pain - started over summer. Pressure like pain midchest, 8-10 in severity. Mainly occurrs with exertion. Resolves with rest. Increase frequency, increase in severity over last few weeks. Can be somewhat positional - episode a few nights ago while walking up stairs, more severe than prevoius  CAD risk factors: HTN, former smoke x 5 years. Father died 6467 suddenly. Grandfather maternal MI 8267. Mother with stent placed around age 53.    2. HTN - in pcp office SBP in 200s - noncompliant with meds previously - restarted lisinopril, bp's have trended down  Past Medical History:  Diagnosis Date  . Alcohol dependence (HCC)   . Anxiety   . Chest pain   . GERD (gastroesophageal reflux disease)   . Hypertension   . Insomnia   . Plantar fascial fibromatosis   . Psoriasis      No Known Allergies   Current Outpatient Prescriptions  Medication Sig Dispense Refill  . clonazePAM (KLONOPIN) 0.5 MG tablet Take 0.5 mg by mouth 3 (three) times daily as needed for anxiety.    Marland Kitchen. lisinopril (PRINIVIL,ZESTRIL) 20 MG tablet Take 20 mg by mouth daily.     Marland Kitchen. oxyCODONE-acetaminophen (PERCOCET) 10-325 MG per tablet Take 1 tablet by mouth every 4 (four) hours as needed for pain. 30 tablet 0   No current facility-administered medications for this visit.      Past Surgical History:  Procedure Laterality Date  . HEMORRHOID SURGERY     residual skin tags  . right ankle    . UMBILICAL HERNIA REPAIR N/A 06/07/2014   Procedure: HERNIA REPAIR UMBILICAL ADULT;  Surgeon: Marlane HatcherWilliam S Bradford, MD;  Location: [facility];  Service: General;  Laterality: N/A;     No Known Allergies    Family history Father died 1067 suddenly. Grandfather maternal MI 2767. Mother with stent placed around age 53.     Social History [name] reports that he has never smoked. He does not have any smokeless  tobacco history on file. [name] reports that he drinks alcohol.   Review of Systems CONSTITUTIONAL: No weight loss, fever, chills, weakness or fatigue.  HEENT: Eyes: No visual loss, blurred vision, double vision or yellow sclerae.No hearing loss, sneezing, congestion, runny nose or sore throat.  SKIN: No rash or itching.  CARDIOVASCULAR: per hpi RESPIRATORY: No shortness of breath, cough or sputum.  GASTROINTESTINAL: No anorexia, nausea, vomiting or diarrhea. No abdominal pain or blood.  GENITOURINARY: No burning on urination, no polyuria NEUROLOGICAL: No headache, dizziness, syncope, paralysis, ataxia, numbness or tingling in the extremities. No change in bowel or bladder control.  MUSCULOSKELETAL: No muscle, back pain, joint pain or stiffness.  LYMPHATICS: No enlarged nodes. No history of splenectomy.  PSYCHIATRIC: No history of depression or anxiety.  ENDOCRINOLOGIC: No reports of sweating, cold or heat intolerance. No polyuria or polydipsia.  Marland Kitchen.   Physical Examination Vitals:   11/06/16 1028 11/06/16 1035  BP: 126/78 128/75  Pulse: 82 81   Vitals:   11/06/16 1028  Weight: 202 lb (91.6 kg)  Height: 6' (1.829 m)    Gen: resting comfortably, no acute distress HEENT: no scleral icterus, pupils equal round and reactive, no palptable cervical adenopathy,  CV: RRR, no m/r/g, no jvd Resp: Clear to auscultation bilaterally GI: abdomen is soft, non-tender, non-distended, normal bowel sounds, no hepatosplenomegaly MSK: extremities are warm, no edema.  Skin: warm, no rash Neuro:  no focal deficits Psych: appropriate affect   Assessment and Plan  1. Chest pain - patient with CAD risk factors. Baseline EKG SR, chronic ST/T changes - we will order an exercise nuclear stress test - start ASA 81mg  daily  2. HTN - long history of noncompliance. Started on lisiontpil recently, presures are at goal - continue to monitor.   F/u pending stress results   Troy PocheJonathan F. Jaicion [name],  M.D.

## 2016-11-06 NOTE — Patient Instructions (Signed)
Your physician recommends that you schedule a follow-up appointment TO BE DETERMINED AFTER TEST  Your physician has recommended you make the following change in your medication:   START ASPIRIN 81 MG DAILY  Your physician has requested that you have en exercise stress myoview. For further information please visit [email or web address]. Please follow instruction sheet, as given.  Thank you for choosing [facility]!!

## 2016-11-12 ENCOUNTER — Encounter (HOSPITAL_COMMUNITY): Payer: Managed Care, Other (non HMO)

## 2016-11-12 ENCOUNTER — Inpatient Hospital Stay (HOSPITAL_COMMUNITY): Admission: RE | Admit: 2016-11-12 | Payer: Managed Care, Other (non HMO) | Source: Ambulatory Visit

## 2016-11-17 ENCOUNTER — Encounter: Payer: Self-pay | Admitting: Internal Medicine

## 2016-11-25 ENCOUNTER — Encounter: Payer: Self-pay | Admitting: Gastroenterology

## 2016-11-25 ENCOUNTER — Other Ambulatory Visit: Payer: Self-pay

## 2016-11-25 ENCOUNTER — Ambulatory Visit (INDEPENDENT_AMBULATORY_CARE_PROVIDER_SITE_OTHER): Payer: Managed Care, Other (non HMO) | Admitting: Gastroenterology

## 2016-11-25 DIAGNOSIS — D508 Other iron deficiency anemias: Secondary | ICD-10-CM

## 2016-11-25 DIAGNOSIS — D509 Iron deficiency anemia, unspecified: Secondary | ICD-10-CM

## 2016-11-25 HISTORY — DX: Iron deficiency anemia, unspecified: D50.9

## 2016-11-25 MED ORDER — PEG 3350-KCL-NA BICARB-NACL 420 G PO SOLR: 4000.0000 mL | ORAL | 0 refills | Status: DC

## 2016-11-25 NOTE — Patient Instructions (Signed)
Please have blood work done today.  We have scheduled you for a colonoscopy and upper endoscopy in the near future.  Further recommendations after these are completed.

## 2016-11-25 NOTE — Progress Notes (Addendum)
REVIEWED-NO ADDITIONAL RECOMMENDATIONS.     Primary Care Physician:  Milana Obey, MD Primary Gastroenterologist:  Dr. Darrick Penna   Chief Complaint  Patient presents with  . Anemia  . Weakness    HPI:   Troy [name] is a 56 y.o. male presenting today at the request of Dr. Sudie Bailey secondary to IDA. Outside labs from 11/07/16 with Hgb 7.6, microcytic anemia, platelets 305, iron 14, ferritin 4. Reports colonoscopy likely through our practice but unable to find reports, over 10 years ago. Believes was 2004. Dr. 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   Notes chest discomfort starting in the summer, occurring with exertion. BP 220/150 at Dr. Michelle Nasuti office a few months ago but better now after starting BP medication. Feels weak, wants to sleep a lot. Doesn't feel dizzy. Takes a pepcid if eating something spicy. Doesn't take anything on a regular basis. Saw cardiology prior to blood work being completed. Cardiology evaluation was ordered due to chest tightness on exertion. Plans had been made for a stress test as outpatient, but patient states he was told to see GI first by his PCP and hold on stress test for now due to new finding of significant IDA. EKG done with cardiology showed sinus rhythm and chronic ST/T changes.    Has had low-volume hematochezia intermittently. No change in caliber of stools. No melena. No abdominal pain.   Used to weigh around 212, today 202. States his clothes are loose. Feels like his legs are smaller. No significant appetite changes. Since starting BP medications, may skip a meal here and there. Used to take NSAIDs in 2015. No aspirin powders. He is still working and pushing through the fatigue. Rests as needed and then resumes daily activities.   Past Medical History:  Diagnosis Date  . Alcohol dependence (HCC)   . Anxiety   . Chest pain   . GERD (gastroesophageal reflux disease)   . Hypertension   . Insomnia   . Plantar  fascial fibromatosis   . Psoriasis     Past Surgical History:  Procedure Laterality Date  . ESOPHAGOGASTRODUODENOSCOPY  2004   Dr. 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 HEMORRHOID SURGERY     residual skin tags  . right ankle    . UMBILICAL HERNIA REPAIR N/A 06/07/2014   Procedure: HERNIA REPAIR UMBILICAL ADULT;  Surgeon: Marlane Hatcher, MD;  Location: [facility];  Service: General;  Laterality: N/A;    Current Outpatient Prescriptions  Medication Sig Dispense Refill  . famotidine (PEPCID AC) 10 MG chewable tablet Chew 10 mg by mouth 2 (two) times daily as needed for heartburn.    Marland Kitchen lisinopril (PRINIVIL,ZESTRIL) 20 MG tablet Take 10 mg by mouth daily.      No current facility-administered medications for this visit.     Allergies as of 11/25/2016  . (No Known Allergies)    Family History  Problem Relation Age of Onset  . Heart disease Mother     stent placement  . Heart attack Father     died from MR age 50  . Heart attack Maternal Grandmother   . Colon cancer Paternal Grandfather     Social History   Social History  . Marital status: Married    Spouse name: N/A  . Number of children: N/A  . Years of education: N/A   Occupational History  . Not on file.   Social History Main Topics  . Smoking  status: Never Smoker  . Smokeless tobacco: Never Used  . Alcohol use Yes     Comment: 6-12 beers at a time sometimes daily.   . Drug use: No  . Sexual activity: Yes   Other Topics Concern  . Not on file   Social History Narrative  . No narrative on file    Review of Systems: As mentioned in HPI.    Physical Exam: BP (!) 152/89   Pulse 82   Temp 97.4 F (36.3 C) (Oral)   Ht 6\' 1"  (1.854 m)   Wt 202 lb 12.8 oz (92 kg)   BMI 26.76 kg/m  General:   Alert and oriented. Pleasant and cooperative. Well-nourished and well-developed.  Head:  Normocephalic and atraumatic. Eyes:  Without icterus, sclera clear and conjunctiva pale Ears:   Normal auditory acuity. Lungs:  Clear to auscultation bilaterally. No wheezes, rales, or rhonchi. No distress.  Heart:  S1, S2 present without murmurs appreciated.  Abdomen:  +BS, soft, non-tender and non-distended. No HSM noted. No guarding or rebound. No masses appreciated.  Rectal:  Deferred  Msk:  Symmetrical without gross deformities. Normal posture. Extremities:  Without edema. Neurologic:  Alert and  oriented x4;  grossly normal neurologically. Psych:  Alert and cooperative. Normal mood and affect.

## 2016-11-26 ENCOUNTER — Telehealth: Payer: Self-pay

## 2016-11-26 ENCOUNTER — Encounter: Payer: Self-pay | Admitting: Gastroenterology

## 2016-11-26 ENCOUNTER — Encounter (HOSPITAL_COMMUNITY)
Admission: RE | Admit: 2016-11-26 | Discharge: 2016-11-26 | Disposition: A | Discharge status: Home / Self Care | Payer: Managed Care, Other (non HMO) | Source: Ambulatory Visit | Attending: Gastroenterology | Admitting: Gastroenterology

## 2016-11-26 DIAGNOSIS — D509 Iron deficiency anemia, unspecified: Secondary | ICD-10-CM | POA: Diagnosis not present

## 2016-11-26 LAB — CBC
HEMATOCRIT: 28.3 % — AB (ref 38.5–50.0)
HEMOGLOBIN: 8 g/dL — AB (ref 13.2–17.1)
MCH: 18.8 pg — ABNORMAL LOW (ref 27.0–33.0)
MCHC: 28.3 g/dL — ABNORMAL LOW (ref 32.0–36.0)
MCV: 66.4 fL — ABNORMAL LOW (ref 80.0–100.0)
MPV: 9.5 fL (ref 7.5–12.5)
Platelets: 384 10*3/uL (ref 140–400)
RBC: 4.26 MIL/uL (ref 4.20–5.80)
RDW: 16.4 % — ABNORMAL HIGH (ref 11.0–15.0)
WBC: 4.4 10*3/uL (ref 3.8–10.8)

## 2016-11-26 LAB — PREPARE RBC (CROSSMATCH)

## 2016-11-26 LAB — ABO/RH: ABO/RH(D): A POS

## 2016-11-26 NOTE — Patient Instructions (Signed)
Troy [name]  11/26/2016     @PREFPERIOPPHARMACY @   Your procedure is scheduled on  12/01/2016 .  Report to [facility] at  1200  P.M.  Call this number if you have problems the morning of surgery:  [phone]   Remember:  Do not eat food or drink liquids after midnight.  Take these medicines the morning of surgery with A SIP OF WATER  Pepcid, lisinopril.   Do not wear jewelry, make-up or nail polish.  Do not wear lotions, powders, or perfumes, or deoderant.  Do not shave 48 hours prior to surgery.  Men may shave face and neck.  Do not bring valuables to the hospital.  [facility] is not responsible for any belongings or valuables.  Contacts, dentures or bridgework may not be worn into surgery.  Leave your suitcase in the car.  After surgery it may be brought to your room.  For patients admitted to the hospital, discharge time will be determined by your treatment team.  Patients discharged the day of surgery will not be allowed to drive home.   Name and phone number of your driver:   family Special instructions:  Follow the diet and prep instructions given to you by Dr Evelina Dun office.  Please read over the following fact sheets that you were given. Anesthesia Post-op Instructions and Care and Recovery After Surgery       Esophagogastroduodenoscopy Introduction Esophagogastroduodenoscopy (EGD) is a procedure to examine the lining of the esophagus, stomach, and first part of the small intestine (duodenum). This procedure is done to check for problems such as inflammation, bleeding, ulcers, or growths. During this procedure, a long, flexible, lighted tube with a camera attached (endoscope) is inserted down the throat. Tell a health care provider about:  Any allergies you have.  All medicines you are taking, including vitamins, herbs, eye drops, creams, and over-the-counter medicines.  Any problems you or family members have had with anesthetic  medicines.  Any blood disorders you have.  Any surgeries you have had.  Any medical conditions you have.  Whether you are pregnant or may be pregnant. What are the risks? Generally, this is a safe procedure. However, problems may occur, including:  Infection.  Bleeding.  A tear (perforation) in the esophagus, stomach, or duodenum.  Trouble breathing.  Excessive sweating.  Spasms of the larynx.  A slowed heartbeat.  Low blood pressure. What happens before the procedure?  Follow instructions from your health care provider about eating or drinking restrictions.  Ask your health care provider about:  Changing or stopping your regular medicines. This is especially important if you are taking diabetes medicines or blood thinners.  Taking medicines such as aspirin and ibuprofen. These medicines can thin your blood. Do not take these medicines before your procedure if your health care provider instructs you not to.  Plan to have someone take you home after the procedure.  If you wear dentures, be ready to remove them before the procedure. What happens during the procedure?  To reduce your risk of infection, your health care team will wash or sanitize their hands.  An IV tube will be put in a vein in your hand or arm. You will get medicines and fluids through this tube.  You will be given one or more of the following:  A medicine to help you relax (sedative).  A medicine to numb the area (local anesthetic). This  medicine may be sprayed into your throat. It will make you feel more comfortable and keep you from gagging or coughing during the procedure.  A medicine for pain.  A mouth guard may be placed in your mouth to protect your teeth and to keep you from biting on the endoscope.  You will be asked to lie on your left side.  The endoscope will be lowered down your throat into your esophagus, stomach, and duodenum.  Air will be put into the endoscope. This will help  your health care provider see better.  The lining of your esophagus, stomach, and duodenum will be examined.  Your health care provider may:  Take a tissue sample so it can be looked at in a lab (biopsy).  Remove growths.  Remove objects (foreign bodies) that are stuck.  Treat any bleeding with medicines or other devices that stop tissue from bleeding.  Widen (dilate) or stretch narrowed areas of your esophagus and stomach.  The endoscope will be taken out. The procedure may vary among health care providers and hospitals. What happens after the procedure?  Your blood pressure, heart rate, breathing rate, and blood oxygen level will be monitored often until the medicines you were given have worn off.  Do not eat or drink anything until the numbing medicine has worn off and your gag reflex has returned. This information is not intended to replace advice given to you by your health care provider. Make sure you discuss any questions you have with your health care provider. Document Released: 03/05/2005 Document Revised: 04/09/2016 Document Reviewed: 09/26/2015  2017 Elsevier Esophagogastroduodenoscopy, Care After Introduction Refer to this sheet in the next few weeks. These instructions provide you with information about caring for yourself after your procedure. Your health care provider may also give you more specific instructions. Your treatment has been planned according to current medical practices, but problems sometimes occur. Call your health care provider if you have any problems or questions after your procedure. What can I expect after the procedure? After the procedure, it is common to have:  A sore throat.  Nausea.  Bloating.  Dizziness.  Fatigue. Follow these instructions at home:  Do not eat or drink anything until the numbing medicine (local anesthetic) has worn off and your gag reflex has returned. You will know that the local anesthetic has worn off when you  can swallow comfortably.  Do not drive for 24 hours if you received a medicine to help you relax (sedative).  If your health care provider took a tissue sample for testing during the procedure, make sure to get your test results. This is your responsibility. Ask your health care provider or the department performing the test when your results will be ready.  Keep all follow-up visits as told by your health care provider. This is important. Contact a health care provider if:  You cannot stop coughing.  You are not urinating.  You are urinating less than usual. Get help right away if:  You have trouble swallowing.  You cannot eat or drink.  You have throat or chest pain that gets worse.  You are dizzy or light-headed.  You faint.  You have nausea or vomiting.  You have chills.  You have a fever.  You have severe abdominal pain.  You have black, tarry, or bloody stools. This information is not intended to replace advice given to you by your health care provider. Make sure you discuss any questions you have with your health care  provider. Document Released: 10/19/2012 Document Revised: 04/09/2016 Document Reviewed: 09/26/2015  2017 Elsevier  Colonoscopy, Adult A colonoscopy is an exam to look at the entire large intestine. During the exam, a lubricated, bendable tube is inserted into the anus and then passed into the rectum, colon, and other parts of the large intestine. A colonoscopy is often done as a part of normal colorectal screening or in response to certain symptoms, such as anemia, persistent diarrhea, abdominal pain, and blood in the stool. The exam can help screen for and diagnose medical problems, including:  Tumors.  Polyps.  Inflammation.  Areas of bleeding. Tell a health care provider about:  Any allergies you have.  All medicines you are taking, including vitamins, herbs, eye drops, creams, and over-the-counter medicines.  Any problems you or family  members have had with anesthetic medicines.  Any blood disorders you have.  Any surgeries you have had.  Any medical conditions you have.  Any problems you have had passing stool. What are the risks? Generally, this is a safe procedure. However, problems may occur, including:  Bleeding.  A tear in the intestine.  A reaction to medicines given during the exam.  Infection (rare). What happens before the procedure? Eating and drinking restrictions  Follow instructions from your health care provider about eating and drinking, which may include:  A few days before the procedure - follow a low-fiber diet. Avoid nuts, seeds, dried fruit, raw fruits, and vegetables.  1-3 days before the procedure - follow a clear liquid diet. Drink only clear liquids, such as clear broth or bouillon, black coffee or tea, clear juice, clear soft drinks or sports drinks, gelatin desert, and popsicles. Avoid any liquids that contain red or purple dye.  On the day of the procedure - do not eat or drink anything during the 2 hours before the procedure, or within the time period that your health care provider recommends. Bowel prep  If you were prescribed an oral bowel prep to clean out your colon:  Take it as told by your health care provider. Starting the day before your procedure, you will need to drink a large amount of medicated liquid. The liquid will cause you to have multiple loose stools until your stool is almost clear or light green.  If your skin or anus gets irritated from diarrhea, you may use these to relieve the irritation:  Medicated wipes, such as adult wet wipes with aloe and vitamin E.  A skin soothing-product like petroleum jelly.  If you vomit while drinking the bowel prep, take a break for up to 60 minutes and then begin the bowel prep again. If vomiting continues and you cannot take the bowel prep without vomiting, call your health care provider. General instructions  Ask your  health care provider about changing or stopping your regular medicines. This is especially important if you are taking diabetes medicines or blood thinners.  Plan to have someone take you home from the hospital or clinic. What happens during the procedure?  An IV tube may be inserted into one of your veins.  You will be given medicine to help you relax (sedative).  To reduce your risk of infection:  Your health care team will wash or sanitize their hands.  Your anal area will be washed with soap.  You will be asked to lie on your side with your knees bent.  Your health care provider will lubricate a long, thin, flexible tube. The tube will have a camera and  a light on the end.  The tube will be inserted into your anus.  The tube will be gently eased through your rectum and colon.  Air will be delivered into your colon to keep it open. You may feel some pressure or cramping.  The camera will be used to take images during the procedure.  A small tissue sample may be removed from your body to be examined under a microscope (biopsy). If any potential problems are found, the tissue will be sent to a lab for testing.  If small polyps are found, your health care provider may remove them and have them checked for cancer cells.  The tube that was inserted into your anus will be slowly removed. The procedure may vary among health care providers and hospitals. What happens after the procedure?  Your blood pressure, heart rate, breathing rate, and blood oxygen level will be monitored until the medicines you were given have worn off.  Do not drive for 24 hours after the exam.  You may have a small amount of blood in your stool.  You may pass gas and have mild abdominal cramping or bloating due to the air that was used to inflate your colon during the exam.  It is up to you to get the results of your procedure. Ask your health care provider, or the department performing the procedure,  when your results will be ready. This information is not intended to replace advice given to you by your health care provider. Make sure you discuss any questions you have with your health care provider. Document Released: 10/30/2000 Document Revised: 05/22/2016 Document Reviewed: 01/14/2016 Elsevier Interactive Patient Education  2017 [employer] Inc.  Colonoscopy, Adult, Care After This sheet gives you information about how to care for yourself after your procedure. Your health care provider may also give you more specific instructions. If you have problems or questions, contact your health care provider. What can I expect after the procedure? After the procedure, it is common to have:  A small amount of blood in your stool for 24 hours after the procedure.  Some gas.  Mild abdominal cramping or bloating. Follow these instructions at home: General instructions  For the first 24 hours after the procedure:  Do not drive or use machinery.  Do not sign important documents.  Do not drink alcohol.  Do your regular daily activities at a slower pace than normal.  Eat soft, easy-to-digest foods.  Rest often.  Take over-the-counter or prescription medicines only as told by your health care provider.  It is up to you to get the results of your procedure. Ask your health care provider, or the department performing the procedure, when your results will be ready. Relieving cramping and bloating  Try walking around when you have cramps or feel bloated.  Apply heat to your abdomen as told by your health care provider. Use a heat source that your health care provider recommends, such as a moist heat pack or a heating pad.  Place a towel between your skin and the heat source.  Leave the heat on for 20-30 minutes.  Remove the heat if your skin turns bright red. This is especially important if you are unable to feel pain, heat, or cold. You may have a greater risk of getting burned. Eating  and drinking  Drink enough fluid to keep your urine clear or pale yellow.  Resume your normal diet as instructed by your health care provider. Avoid heavy or fried foods that  are hard to digest.  Avoid drinking alcohol for as long as instructed by your health care provider. Contact a health care provider if:  You have blood in your stool 2-3 days after the procedure. Get help right away if:  You have more than a small spotting of blood in your stool.  You pass large blood clots in your stool.  Your abdomen is swollen.  You have nausea or vomiting.  You have a fever.  You have increasing abdominal pain that is not relieved with medicine. This information is not intended to replace advice given to you by your health care provider. Make sure you discuss any questions you have with your health care provider. Document Released: 06/16/2004 Document Revised: 07/27/2016 Document Reviewed: 01/14/2016 Elsevier Interactive Patient Education  2017 Else[employer] Inc.  Monitored Anesthesia Care Anesthesia is a term that refers to techniques, procedures, and medicines that help a person stay safe and comfortable during a medical procedure. Monitored anesthesia care, or sedation, is one type of anesthesia. Your anesthesia specialist may recommend sedation if you will be having a procedure that does not require you to be unconscious, such as:  Cataract surgery.  A dental procedure.  A biopsy.  A colonoscopy. During the procedure, you may receive a medicine to help you relax (sedative). There are three levels of sedation:  Mild sedation. At this level, you may feel awake and relaxed. You will be able to follow directions.  Moderate sedation. At this level, you will be sleepy. You may not remember the procedure.  Deep sedation. At this level, you will be asleep. You will not remember the procedure. The more medicine you are given, the deeper your level of sedation will be. Depending on how you  respond to the procedure, the anesthesia specialist may change your level of sedation or the type of anesthesia to fit your needs. An anesthesia specialist will monitor you closely during the procedure. Let your health care provider know about:  Any allergies you have.  All medicines you are taking, including vitamins, herbs, eye drops, creams, and over-the-counter medicines.  Any use of steroids (by mouth or as a cream).  Any problems you or family members have had with sedatives and anesthetic medicines.  Any blood disorders you have.  Any surgeries you have had.  Any medical conditions you have, such as sleep apnea.  Whether you are pregnant or may be pregnant.  Any use of cigarettes, alcohol, or street drugs. What are the risks? Generally, this is a safe procedure. However, problems may occur, including:  Getting too much medicine (oversedation).  Nausea.  Allergic reaction to medicines.  Trouble breathing. If this happens, a breathing tube may be used to help with breathing. It will be removed when you are awake and breathing on your own.  Heart trouble.  Lung trouble. Before the procedure Staying hydrated  Follow instructions from your health care provider about hydration, which may include:  Up to 2 hours before the procedure - you may continue to drink clear liquids, such as water, clear fruit juice, black coffee, and plain tea. Eating and drinking restrictions  Follow instructions from your health care provider about eating and drinking, which may include:  8 hours before the procedure - stop eating heavy meals or foods such as meat, fried foods, or fatty foods.  6 hours before the procedure - stop eating light meals or foods, such as toast or cereal.  6 hours before the procedure - stop drinking milk or  drinks that contain milk.  2 hours before the procedure - stop drinking clear liquids. Medicines  Ask your health care provider about:  Changing or  stopping your regular medicines. This is especially important if you are taking diabetes medicines or blood thinners.  Taking medicines such as aspirin and ibuprofen. These medicines can thin your blood. Do not take these medicines before your procedure if your health care provider instructs you not to. Tests and exams  You will have a physical exam.  You may have blood tests done to show:  How well your kidneys and liver are working.  How well your blood can clot.  General instructions  Plan to have someone take you home from the hospital or clinic.  If you will be going home right after the procedure, plan to have someone with you for 24 hours. What happens during the procedure?  Your blood pressure, heart rate, breathing, level of pain and overall condition will be monitored.  An IV tube will be inserted into one of your veins.  Your anesthesia specialist will give you medicines as needed to keep you comfortable during the procedure. This may mean changing the level of sedation.  The procedure will be performed. After the procedure  Your blood pressure, heart rate, breathing rate, and blood oxygen level will be monitored until the medicines you were given have worn off.  Do not drive for 24 hours if you received a sedative.  You may:  Feel sleepy, clumsy, or nauseous.  Feel forgetful about what happened after the procedure.  Have a sore throat if you had a breathing tube during the procedure.  Vomit. This information is not intended to replace advice given to you by your health care provider. Make sure you discuss any questions you have with your health care provider. Document Released: 07/29/2005 Document Revised: 04/10/2016 Document Reviewed: 02/23/2016 Elsevier Interactive Patient Education  2017 [employer] Inc. Monitored Anesthesia Care, Care After These instructions provide you with information about caring for yourself after your procedure. Your health care  provider may also give you more specific instructions. Your treatment has been planned according to current medical practices, but problems sometimes occur. Call your health care provider if you have any problems or questions after your procedure. What can I expect after the procedure? After your procedure, it is common to:  Feel sleepy for several hours.  Feel clumsy and have poor balance for several hours.  Feel forgetful about what happened after the procedure.  Have poor judgment for several hours.  Feel nauseous or vomit.  Have a sore throat if you had a breathing tube during the procedure. Follow these instructions at home: For at least 24 hours after the procedure:   Do not:  Participate in activities in which you could fall or become injured.  Drive.  Use heavy machinery.  Drink alcohol.  Take sleeping pills or medicines that cause drowsiness.  Make important decisions or sign legal documents.  Take care of children on your own.  Rest. Eating and drinking  Follow the diet that is recommended by your health care provider.  If you vomit, drink water, juice, or soup when you can drink without vomiting.  Make sure you have little or no nausea before eating solid foods. General instructions  Have a responsible adult stay with you until you are awake and alert.  Take over-the-counter and prescription medicines only as told by your health care provider.  If you smoke, do not smoke without supervision.  Keep all follow-up visits as told by your health care provider. This is important. Contact a health care provider if:  You keep feeling nauseous or you keep vomiting.  You feel light-headed.  You develop a rash.  You have a fever. Get help right away if:  You have trouble breathing. This information is not intended to replace advice given to you by your health care provider. Make sure you discuss any questions you have with your health care  provider. Document Released: 02/23/2016 Document Revised: 06/24/2016 Document Reviewed: 02/23/2016 Elsevier Interactive Patient Education  2017 [employer].

## 2016-11-26 NOTE — Telephone Encounter (Signed)
Called pt and informed of pre-op appt 11/30/16 at 2:15 pm.

## 2016-11-26 NOTE — Progress Notes (Signed)
Hgb up slightly from 7.6, now 8. I believe patient needs 1 unit PRBCs before procedure. No need for ED, as we can arrange this as outpatient. Contacted patient and got disconnected but prior to disconnection he would rather not pursue blood transfusion unless absolutely necessary. I discussed with him that it would not get better on its own, and since he has been so fatigued, it would be prudent to pursue just 1 unit for now. After being disconnected, spoke with wife and informed that if he goes to admissions on Friday at 2:30 pm for type/cross and get blood band, he can then have a transfusion on Monday at 8am. Wife will discuss this with patient and return the call to us this morning.

## 2016-11-26 NOTE — Progress Notes (Signed)
cc'd to pcp 

## 2016-11-26 NOTE — Assessment & Plan Note (Addendum)
56 year old very pleasant male presenting with new onset significant IDA. Hgb 7.6 over 2 weeks ago, ferritin 4 and iron 14. Microcytic pattern. Low-volume hematochezia noted chronically, weight loss. Last colonoscopy/EGD over 10 years ago. Despite continuing to work, he does note fatigue and has to rest at times. Chest discomfort had been noted starting over the summer, associated with exertion. He saw cardiology prior to blood work ordered and had been referred for a stress test, but this was put on hold per patient due to new findings of IDA. I feel his symptoms of fatigue, chest discomfort and decreased physical capacity is secondary to anemia (these findings were not known at time of cardiology appt). I will discuss with Dr. Darrick PennaFields and need for cardiac clearance prior to procedure. (addendum, discussed briefly with Dr. Darrick PennaFields. Proceed as planned for now. Pre-op appt with anesthesia schedule for 11/30/16. EKG on file from 10/2016. )   Weight loss, hematochezia, IDA concerning. Recheck CBC today. I discussed I had a low threshold for transfusion due to his symptoms. Patient understood. Wife present.  Plan on TCS/EGD in near future with Dr. Darrick PennaFields, utilizing Propofol due to history of daily ETOH use. No known liver disease, but I did counsel patient on abstinence and risk of liver disease. Risks and benefits of procedure discussed with stated understanding.

## 2016-11-26 NOTE — Progress Notes (Signed)
Pt is going to have his type and cross at 10:30 Am this morning and his transfusion tomorrow. Eber JonesCarolyn is aware and orders have been faxed to her.

## 2016-11-27 ENCOUNTER — Encounter (HOSPITAL_COMMUNITY)
Admission: RE | Admit: 2016-11-27 | Discharge: 2016-11-27 | Disposition: A | Discharge status: Home / Self Care | Payer: Managed Care, Other (non HMO) | Source: Ambulatory Visit | Attending: Gastroenterology | Admitting: Gastroenterology

## 2016-11-27 DIAGNOSIS — D509 Iron deficiency anemia, unspecified: Secondary | ICD-10-CM | POA: Diagnosis not present

## 2016-11-27 LAB — BASIC METABOLIC PANEL
Anion gap: 8 (ref 5–15)
BUN: 13 mg/dL (ref 6–20)
CHLORIDE: 106 mmol/L (ref 101–111)
CO2: 25 mmol/L (ref 22–32)
CREATININE: 0.96 mg/dL (ref 0.61–1.24)
Calcium: 8.7 mg/dL — ABNORMAL LOW (ref 8.9–10.3)
GFR calc Af Amer: >60 mL/min (ref >60)
GFR calc non Af Amer: >60 mL/min (ref >60)
GLUCOSE: 145 mg/dL — AB (ref 65–99)
Potassium: 3.8 mmol/L (ref 3.5–5.1)
Sodium: 139 mmol/L (ref 135–145)

## 2016-11-27 MED ORDER — SODIUM CHLORIDE 0.9 % IV SOLN
Freq: Once | INTRAVENOUS | Status: AC
  Administered 2016-11-27: 250 mL via INTRAVENOUS

## 2016-11-27 MED ORDER — DIPHENHYDRAMINE HCL 25 MG PO CAPS
25.0000 mg | ORAL_CAPSULE | Freq: Once | ORAL | Status: AC
  Administered 2016-11-27: 25 mg via ORAL
  Filled 2016-11-27: qty 1

## 2016-11-27 MED ORDER — ACETAMINOPHEN 325 MG PO TABS
650.0000 mg | ORAL_TABLET | Freq: Once | ORAL | Status: AC
  Administered 2016-11-27: 650 mg via ORAL
  Filled 2016-11-27: qty 2

## 2016-11-28 LAB — TYPE AND SCREEN
BLOOD PRODUCT EXPIRATION DATE: 201801212359
ISSUE DATE / TIME: 201801121101
Unit Type and Rh: 6200

## 2016-11-30 ENCOUNTER — Encounter (HOSPITAL_COMMUNITY)
Admission: RE | Admit: 2016-11-30 | Discharge: 2016-11-30 | Disposition: A | Discharge status: Home / Self Care | Payer: Managed Care, Other (non HMO) | Source: Ambulatory Visit | Attending: Gastroenterology | Admitting: Gastroenterology

## 2016-11-30 NOTE — Progress Notes (Signed)
Pt received 1 unit of blood on 11/27/2016.  Dr. Jayme CloudGonzalez notified.  Orders given for  H& H on arrival.

## 2016-12-01 ENCOUNTER — Ambulatory Visit (HOSPITAL_COMMUNITY)
Admission: RE | Admit: 2016-12-01 | Discharge: 2016-12-01 | Disposition: A | Discharge status: Home / Self Care | Payer: Managed Care, Other (non HMO) | Source: Ambulatory Visit | Attending: Gastroenterology | Admitting: Gastroenterology

## 2016-12-01 ENCOUNTER — Encounter (HOSPITAL_COMMUNITY)
Admission: RE | Disposition: A | Discharge status: Home / Self Care | Payer: Self-pay | Source: Ambulatory Visit | Attending: Gastroenterology

## 2016-12-01 ENCOUNTER — Ambulatory Visit (HOSPITAL_COMMUNITY): Payer: Managed Care, Other (non HMO) | Admitting: Anesthesiology

## 2016-12-01 ENCOUNTER — Encounter (HOSPITAL_COMMUNITY): Payer: Self-pay | Admitting: Gastroenterology

## 2016-12-01 DIAGNOSIS — K2950 Unspecified chronic gastritis without bleeding: Secondary | ICD-10-CM | POA: Diagnosis not present

## 2016-12-01 DIAGNOSIS — K449 Diaphragmatic hernia without obstruction or gangrene: Secondary | ICD-10-CM | POA: Diagnosis not present

## 2016-12-01 DIAGNOSIS — K648 Other hemorrhoids: Secondary | ICD-10-CM | POA: Insufficient documentation

## 2016-12-01 DIAGNOSIS — F419 Anxiety disorder, unspecified: Secondary | ICD-10-CM | POA: Insufficient documentation

## 2016-12-01 DIAGNOSIS — G4700 Insomnia, unspecified: Secondary | ICD-10-CM | POA: Diagnosis not present

## 2016-12-01 DIAGNOSIS — I10 Essential (primary) hypertension: Secondary | ICD-10-CM | POA: Diagnosis not present

## 2016-12-01 DIAGNOSIS — D509 Iron deficiency anemia, unspecified: Secondary | ICD-10-CM | POA: Insufficient documentation

## 2016-12-01 DIAGNOSIS — K219 Gastro-esophageal reflux disease without esophagitis: Secondary | ICD-10-CM | POA: Insufficient documentation

## 2016-12-01 DIAGNOSIS — K222 Esophageal obstruction: Secondary | ICD-10-CM | POA: Diagnosis not present

## 2016-12-01 DIAGNOSIS — K921 Melena: Secondary | ICD-10-CM

## 2016-12-01 DIAGNOSIS — K2970 Gastritis, unspecified, without bleeding: Secondary | ICD-10-CM

## 2016-12-01 DIAGNOSIS — K644 Residual hemorrhoidal skin tags: Secondary | ICD-10-CM | POA: Insufficient documentation

## 2016-12-01 DIAGNOSIS — K5730 Diverticulosis of large intestine without perforation or abscess without bleeding: Secondary | ICD-10-CM | POA: Insufficient documentation

## 2016-12-01 HISTORY — PX: BIOPSY: SHX5522

## 2016-12-01 HISTORY — PX: COLONOSCOPY WITH PROPOFOL: SHX5780

## 2016-12-01 HISTORY — PX: ESOPHAGOGASTRODUODENOSCOPY (EGD) WITH PROPOFOL: SHX5813

## 2016-12-01 LAB — HEMOGLOBIN AND HEMATOCRIT, BLOOD
HCT: 34.3 % — ABNORMAL LOW (ref 39.0–52.0)
Hemoglobin: 9.9 g/dL — ABNORMAL LOW (ref 13.0–17.0)

## 2016-12-01 SURGERY — COLONOSCOPY WITH PROPOFOL
Anesthesia: Monitor Anesthesia Care

## 2016-12-01 MED ORDER — EPHEDRINE SULFATE 50 MG/ML IJ SOLN
INTRAMUSCULAR | Status: DC | PRN
  Administered 2016-12-01: 10 mg via INTRAVENOUS

## 2016-12-01 MED ORDER — MIDAZOLAM HCL 2 MG/2ML IJ SOLN
INTRAMUSCULAR | Status: AC
  Filled 2016-12-01: qty 2

## 2016-12-01 MED ORDER — MIDAZOLAM HCL 2 MG/2ML IJ SOLN
0.5000 mg | INTRAMUSCULAR | Status: DC | PRN
  Administered 2016-12-01: 2 mg via INTRAVENOUS
  Filled 2016-12-01: qty 2

## 2016-12-01 MED ORDER — BIFERA 28 MG PO TABS: ORAL_TABLET | ORAL | 11 refills | Status: DC

## 2016-12-01 MED ORDER — PROPOFOL 500 MG/50ML IV EMUL
INTRAVENOUS | Status: DC | PRN
  Administered 2016-12-01: 150 ug/kg/min via INTRAVENOUS
  Administered 2016-12-01: 13:00:00 via INTRAVENOUS

## 2016-12-01 MED ORDER — LIDOCAINE VISCOUS 2 % MT SOLN
OROMUCOSAL | Status: AC
  Filled 2016-12-01: qty 15

## 2016-12-01 MED ORDER — PROPOFOL 10 MG/ML IV BOLUS
INTRAVENOUS | Status: AC
  Filled 2016-12-01: qty 20

## 2016-12-01 MED ORDER — OMEPRAZOLE 20 MG PO CPDR: DELAYED_RELEASE_CAPSULE | ORAL | 11 refills | Status: DC

## 2016-12-01 MED ORDER — FENTANYL CITRATE (PF) 100 MCG/2ML IJ SOLN
25.0000 ug | INTRAMUSCULAR | Status: AC | PRN
  Administered 2016-12-01 (×2): 25 ug via INTRAVENOUS

## 2016-12-01 MED ORDER — CHLORHEXIDINE GLUCONATE CLOTH 2 % EX PADS: 6.0000 | MEDICATED_PAD | Freq: Once | CUTANEOUS | Status: DC

## 2016-12-01 MED ORDER — FENTANYL CITRATE (PF) 100 MCG/2ML IJ SOLN
INTRAMUSCULAR | Status: AC
  Filled 2016-12-01: qty 2

## 2016-12-01 MED ORDER — LACTATED RINGERS IV SOLN
INTRAVENOUS | Status: DC
  Administered 2016-12-01: 12:00:00 via INTRAVENOUS

## 2016-12-01 MED ORDER — LIDOCAINE VISCOUS 2 % MT SOLN
15.0000 mL | Freq: Once | OROMUCOSAL | Status: AC
  Administered 2016-12-01: 15 mL via OROMUCOSAL

## 2016-12-01 MED ORDER — MIDAZOLAM HCL 5 MG/5ML IJ SOLN
INTRAMUSCULAR | Status: DC | PRN
  Administered 2016-12-01: 2 mg via INTRAVENOUS

## 2016-12-01 NOTE — H&P (Signed)
  Primary Care Physician:  Milana ObeyStephen D Knowlton, MD Primary Gastroenterologist:  Dr. Darrick PennaFields  Pre-Procedure History & Physical: HPI:  Troy [name] is a 56 y.o. male here for Anemia/BRBPR.  Past Medical History:  Diagnosis Date  . Alcohol dependence (HCC)   . Anxiety   . Chest pain   . GERD (gastroesophageal reflux disease)   . Hypertension   . Insomnia   . Plantar fascial fibromatosis   . Psoriasis     Past Surgical History:  Procedure Laterality Date  . ESOPHAGOGASTRODUODENOSCOPY  2004   Dr. 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 HEMORRHOID SURGERY     residual skin tags  . right ankle    . UMBILICAL HERNIA REPAIR N/A 06/07/2014   Procedure: HERNIA REPAIR UMBILICAL ADULT;  Surgeon: Marlane HatcherWilliam S Bradford, MD;  Location: [facility];  Service: General;  Laterality: N/A;    Prior to Admission medications   Medication Sig Start Date End Date Taking? Authorizing Provider  famotidine (PEPCID AC) 10 MG chewable tablet Chew 10 mg by mouth 2 (two) times daily as needed for heartburn.   Yes Historical Provider, MD  lisinopril (PRINIVIL,ZESTRIL) 20 MG tablet Take 10 mg by mouth daily.    Yes Historical Provider, MD    Allergies as of 11/25/2016  . (No Known Allergies)    Family History  Problem Relation Age of Onset  . Heart disease Mother     stent placement  . Heart attack Father     died from MR age 54  . Heart attack Maternal Grandmother   . Colon cancer Paternal Grandfather     Social History   Social History  . Marital status: Married    Spouse name: N/A  . Number of children: N/A  . Years of education: N/A   Occupational History  . Not on file.   Social History Main Topics  . Smoking status: Never Smoker  . Smokeless tobacco: Never Used  . Alcohol use Yes     Comment: 6-12 beers at a time sometimes daily.   . Drug use: No  . Sexual activity: Yes   Other Topics Concern  . Not on file   Social History Narrative  . No narrative on file     Review of Systems: See HPI, otherwise negative ROS   Physical Exam: BP (!) 143/85   Pulse 68   Temp 98.6 F (37 C) (Oral)   Resp 16   Ht 6\' 1"  (1.854 m)   Wt 202 lb (91.6 kg)   SpO2 100%   BMI 26.65 kg/m  General:   Alert,  pleasant and cooperative in NAD Head:  Normocephalic and atraumatic. Neck:  Supple; Lungs:  Clear throughout to auscultation.    Heart:  Regular rate and rhythm. Abdomen:  Soft, nontender and nondistended. Normal bowel sounds, without guarding, and without rebound.   Neurologic:  Alert and  oriented x4;  grossly normal neurologically.  Impression/Plan:     Anemia/BRBPR  PLAN:  1. TCS/EGD TODAY. DISCUSSED PROCEDURE, BENEFITS, & RISKS: < 1% chance of medication reaction, bleeding, perforation, or rupture of spleen/liver.

## 2016-12-01 NOTE — Op Note (Signed)
[facility] Patient Name: Troy RaidDavid [name] Procedure Date: 12/01/2016 12:53 PM MRN: [id number] Date of Birth: 09/12/1961 Attending MD: Jonette EvaSandi Nash[place] Duquette , MD CSN: [id number] Age: [age] Admit Type: Outpatient Procedure:                Colonoscopy, DIAGNOSTIC Indications:              Hematochezia, Iron deficiency anemia-NO ASA/NSAIDS. Providers:                Jonette EvaSandi Arion Shankles, MD, Criselda PeachesLurae B. Patsy LagerAlbert RN, RN, Burke Keelsrisann                            Tilley, Technician Referring MD:             Gareth MorganSteve Knowlton Medicines:                Propofol per Anesthesia Complications:            No immediate complications. Estimated Blood Loss:     Estimated blood loss: none. Procedure:                Pre-Anesthesia Assessment:                           - Prior to the procedure, a History and Physical                            was performed, and patient medications and                            allergies were reviewed. The patient's tolerance of                            previous anesthesia was also reviewed. The risks                            and benefits of the procedure and the sedation                            options and risks were discussed with the patient.                            All questions were answered, and informed consent                            was obtained. Prior Anticoagulants: The patient has                            taken no previous anticoagulant or antiplatelet                            agents. ASA Grade Assessment: I - A normal, healthy                            patient. After reviewing the risks and benefits,  the patient was deemed in satisfactory condition to                            undergo the procedure. After obtaining informed                            consent, the colonoscope was passed under direct                            vision. Throughout the procedure, the patient's                            blood pressure, pulse, and oxygen saturations  were                            monitored continuously. The [id number] ([id number])                            scope was introduced through the anus and advanced                            to the 15 cm into the ileum. The terminal ileum,                            ileocecal valve, appendiceal orifice, and rectum                            were photographed. The colonoscopy was somewhat                            difficult due to a tortuous colon. Successful                            completion of the procedure was aided by COLOWRAP.                            The patient tolerated the procedure well. The                            quality of the bowel preparation was excellent. Scope In: 1:12:38 PM Scope Out: 1:28:34 PM Scope Withdrawal Time: 0 hours 11 minutes 17 seconds  Total Procedure Duration: 0 hours 15 minutes 56 seconds  Findings:      The terminal ileum appeared normal.      Multiple small and large-mouthed diverticula were found in the sigmoid       colon, descending colon, ascending colon and cecum.      Internal hemorrhoids were found during retroflexion. The hemorrhoids       were small.      External hemorrhoids were found during retroflexion. The hemorrhoids       were large. Impression:               - The examined portion of the ileum was normal.                           -  Diverticulosis in the sigmoid colon, in the                            descending colon, in the ascending colon and in the                            cecum.                           - Internal hemorrhoids.                           - External hemorrhoids.                           - NO SOURCE FOR FEDA IDENTIFIED Moderate Sedation:      Per Anesthesia Care Recommendation:           - High fiber diet.                           - Continue present medications.                           - Repeat colonoscopy in 10 years for surveillance.                           - Return to my office in 3 months.                            - PROCEED TO EGD                           - Patient has a contact number available for                            emergencies. The signs and symptoms of potential                            delayed complications were discussed with the                            patient. Return to normal activities tomorrow.                            Written discharge instructions were provided to the                            patient. Procedure Code(s):        --- Professional ---                           [phone], Colonoscopy, flexible; diagnostic, including                            collection of specimen(s) by brushing or washing,  when performed (separate procedure) Diagnosis Code(s):        --- Professional ---                           K64.4, Residual hemorrhoidal skin tags                           K64.8, Other hemorrhoids                           K92.1, Melena (includes Hematochezia)                           D50.9, Iron deficiency anemia, unspecified                           K57.30, Diverticulosis of large intestine without                            perforation or abscess without bleeding CPT copyright 2016 American [employer]. All rights reserved. The codes documented in this report are preliminary and upon coder review may  be revised to meet current compliance requirements. Jonette Eva, MD Jonette Eva, MD 12/01/2016 2:04:46 PM This report has been signed electronically. Number of Addenda: 0

## 2016-12-01 NOTE — Op Note (Signed)
[facility] Patient Name: Troy RaidDavid Silas Procedure Date: 12/01/2016 1:31 PM MRN: [id number] Date of Birth: 03/10/1961 Attending MD: Jonette EvaSandi Tiphany Fayson , MD CSN: [id number] Age: 56 Admit Type: Outpatient Procedure:                Upper GI endoscopy WITH COLD FORCEPS BIOPSY Indications:              Iron deficiency anemia Providers:                Jonette EvaSandi Tashina Credit, MD, Brain HiltsLurae Albert RN, RN, Burke Keelsrisann                            Tilley, Technician Referring MD:             Gareth MorganSteve Knowlton Medicines:                Propofol per Anesthesia Complications:            No immediate complications. Estimated Blood Loss:     Estimated blood loss was minimal. Procedure:                Pre-Anesthesia Assessment:                           - Prior to the procedure, a History and Physical                            was performed, and patient medications and                            allergies were reviewed. The patient's tolerance of                            previous anesthesia was also reviewed. The risks                            and benefits of the procedure and the sedation                            options and risks were discussed with the patient.                            All questions were answered, and informed consent                            was obtained. Prior Anticoagulants: The patient has                            taken no previous anticoagulant or antiplatelet                            agents. ASA Grade Assessment: I - A normal, healthy                            patient. After reviewing the risks and benefits,  the patient was deemed in satisfactory condition to                            undergo the procedure. After obtaining informed                            consent, the endoscope was passed under direct                            vision. Throughout the procedure, the patient's                            blood pressure, pulse, and oxygen saturations were                           monitored continuously. The [id number] ([id number])                            scope was introduced through the mouth, and                            advanced to the second part of duodenum. The upper                            GI endoscopy was accomplished without difficulty.                            The patient tolerated the procedure well. Scope In: 1:34:48 PM Scope Out: 1:44:59 PM Total Procedure Duration: 0 hours 10 minutes 11 seconds  Findings:      A moderate Schatzki ring (acquired) was found at the gastroesophageal       junction.      A large hiatal hernia was present. Biopsies were taken with a cold       forceps for histology.      Patchy mild inflammation characterized by congestion (edema) and       erythema was found in the gastric antrum. Biopsies were taken with a       cold forceps for Helicobacter pylori testing.      The examined duodenum was normal. Biopsies for histology were taken with       a cold forceps for evaluation of celiac disease. Impression:               - Moderate Schatzki ring.                           - Large hiatal hernia. Biopsied.                           - Gastritis. Biopsied.                           - FEDA MAY BE DUE TO CAMERON'S EROSIONS/ULCERS Moderate Sedation:      Per Anesthesia Care Recommendation:           - High fiber diet.                           -  Continue present medications.                           - ADD Prilosec (omeprazole) 20 mg PO daily. PEPCID                            PRN.                           - Recommend an iron supplement: BIFERA BID.                           - Return to my office in 4 months.                           - Patient has a contact number available for                            emergencies. The signs and symptoms of potential                            delayed complications were discussed with the                            patient. Return to normal activities tomorrow.                             Written discharge instructions were provided to the                            patient. Procedure Code(s):        --- Professional ---                           [phone], Esophagogastroduodenoscopy, flexible,                            transoral; with biopsy, single or multiple Diagnosis Code(s):        --- Professional ---                           K22.2, Esophageal obstruction                           K44.9, Diaphragmatic hernia without obstruction or                            gangrene                           K29.70, Gastritis, unspecified, without bleeding                           D50.9, Iron deficiency anemia, unspecified CPT copyright 2016 American Medical Association. All rights reserved. The codes documented in this report are preliminary and upon coder review may  be revised to meet current compliance requirements. Jonette Eva, MD  Jonette Eva, MD 12/01/2016 2:10:41 PM This report has been signed electronically. Number of Addenda: 0

## 2016-12-01 NOTE — Transfer of Care (Signed)
Immediate Anesthesia Transfer of Care Note  Patient: Troy [name]  Procedure(s) Performed: Procedure(s) with comments: COLONOSCOPY WITH PROPOFOL (N/A) - 1:45 pm ESOPHAGOGASTRODUODENOSCOPY (EGD) WITH PROPOFOL (N/A) BIOPSY - gastric duodenal hiatal hernia pouch  Patient Location: PACU  Anesthesia Type:MAC  Level of Consciousness: sedated  Airway & Oxygen Therapy: Patient Spontanous Breathing and Patient connected to face mask oxygen  Post-op Assessment: Report given to RN, Post -op Vital signs reviewed and stable and Patient moving all extremities  Post vital signs: Reviewed and stable  Last Vitals:  Vitals:   12/01/16 1214 12/01/16 1257  BP: (!) 143/85 117/77  Pulse:    Resp: 16 15  Temp:      Last Pain:  Vitals:   12/01/16 1144  TempSrc: Oral      Patients Stated Pain Goal: 7 (56/31/49 7026)  Complications: No apparent anesthesia complications

## 2016-12-01 NOTE — Anesthesia Preprocedure Evaluation (Signed)
Anesthesia Evaluation  Patient identified by MRN, date of birth, ID band Patient awake    Reviewed: Allergy & Precautions, H&P , NPO status , Patient's Chart, lab work & pertinent test results  History of Anesthesia Complications (+) PONV  Airway Mallampati: II  TM Distance: >3 FB     Dental  (+) Teeth Intact   Pulmonary neg pulmonary ROS,    breath sounds clear to auscultation       Cardiovascular hypertension, Pt. on medications  Rhythm:Regular Rate:Normal     Neuro/Psych PSYCHIATRIC DISORDERS Anxiety    GI/Hepatic GERD  Medicated and Controlled,  Endo/Other    Renal/GU      Musculoskeletal   Abdominal   Peds  Hematology  (+) anemia ,   Anesthesia Other Findings   Reproductive/Obstetrics                             Anesthesia Physical Anesthesia Plan  ASA: II  Anesthesia Plan: MAC   Post-op Pain Management:    Induction: Intravenous  Airway Management Planned: Simple Face Mask  Additional Equipment:   Intra-op Plan:   Post-operative Plan:   Informed Consent: I have reviewed the patients History and Physical, chart, labs and discussed the procedure including the risks, benefits and alternatives for the proposed anesthesia with the patient or authorized representative who has indicated his/her understanding and acceptance.     Plan Discussed with:   Anesthesia Plan Comments:         Anesthesia Quick Evaluation

## 2016-12-01 NOTE — Anesthesia Postprocedure Evaluation (Signed)
Anesthesia Post Note  Patient: Troy [name]  Procedure(s) Performed: Procedure(s) (LRB): COLONOSCOPY WITH PROPOFOL (N/A) ESOPHAGOGASTRODUODENOSCOPY (EGD) WITH PROPOFOL (N/A) BIOPSY  Patient location during evaluation: PACU Anesthesia Type: MAC Level of consciousness: awake and patient cooperative Pain management: pain level controlled Vital Signs Assessment: post-procedure vital signs reviewed and stable Respiratory status: spontaneous breathing, nonlabored ventilation and respiratory function stable Cardiovascular status: blood pressure returned to baseline Postop Assessment: no signs of nausea or vomiting Anesthetic complications: no     Last Vitals:  Vitals:   12/01/16 1214 12/01/16 1257  BP: (!) 143/85 117/77  Pulse:    Resp: 16 15  Temp:      Last Pain:  Vitals:   12/01/16 1144  TempSrc: Oral                 Elveta Rape J

## 2016-12-03 ENCOUNTER — Encounter (HOSPITAL_COMMUNITY): Payer: Self-pay | Admitting: Gastroenterology

## 2016-12-04 ENCOUNTER — Ambulatory Visit: Payer: Managed Care, Other (non HMO) | Admitting: Gastroenterology

## 2016-12-06 NOTE — Discharge Instructions (Signed)
NO OBVIOUS SOURCE FOR YOUR IRON DEFICIENCY ANEMIA WAS IDENTIFIED. YOU DID NOT HAVE ANY POLYPS. YOU HAVE DIVERTICULOSIS IN YOUR RIGHT AND LEFT COLON. YOU HAVE SMALL INTERNAL HEMORRHOIDS, WHICH CAN CAUSE RECTAL BLEEDING. YOU HAVE LARGE EXTERNAL HEMORRHOIDS AND SKIN TAGS. 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 YOU HAVE MILD GASTRITIS. THE LAST PART OF YOU SMALL BOWEL IS NORMAL.  I BIOPSIED YOUR PROXIMAL AND DISTAL STOMACH AS WELL AS YOUR SMALL BOWEL.    START BIEFRA BID FOR 3 MOS.  FOLLOW A HIGH FIBER DIET. AVOID ITEMS THAT CAUSE BLOATING. SEE INFO BELOW.   Add omeprazole 20 mg daily TO treat gastritis and prevent ulcers.  USE PEPCID IF NEEDED TO TREAT HEARTBURN.   YOUR BIOPSY RESULTS WILL BE AVAILABLE IN MY CHART AFTER JAN 19 AND MY OFFICE WILL CONTACT YOU IN 10-14 DAYS WITH YOUR RESULTS.   Follow up in April 2018.  Next colonoscopy in 10 years.    ENDOSCOPY Care After Read the instructions outlined below and refer to this sheet in the next week. These discharge instructions provide you with general information on caring for yourself after you leave the hospital. While your treatment has been planned according to the most current medical practices available, unavoidable complications occasionally occur. If you have any problems or questions after discharge, call DR. Lamon Rotundo, [phone].  ACTIVITY  You may resume your regular activity, but move at a slower pace for the next 24 hours.   Take frequent rest periods for the next 24 hours.   Walking will help get rid of the air and reduce the bloated feeling in your belly (abdomen).   No driving for 24 hours (because of the medicine (anesthesia) used during the test).   You may shower.   Do not sign any important legal documents or operate any machinery for 24 hours (because of the anesthesia used during the test).    NUTRITION  Drink plenty of fluids.   You may  resume your normal diet as instructed by your doctor.   Begin with a light meal and progress to your normal diet. Heavy or fried foods are harder to digest and may make you feel sick to your stomach (nauseated).   Avoid alcoholic beverages for 24 hours or as instructed.    MEDICATIONS  You may resume your normal medications.   WHAT YOU CAN EXPECT TODAY  Some feelings of bloating in the abdomen.   Passage of more gas than usual.   Spotting of blood in your stool or on the toilet paper  .  IF YOU HAD POLYPS REMOVED DURING THE ENDOSCOPY:  Eat a soft diet IF YOU HAVE NAUSEA, BLOATING, ABDOMINAL PAIN, OR VOMITING.    FINDING OUT THE RESULTS OF YOUR TEST Not all test results are available during your visit. DR. Darrick Penna WILL CALL YOU WITHIN 14 DAYS OF YOUR PROCEDUE WITH YOUR RESULTS. Do not assume everything is normal if you have not heard from DR. Meredith Mody HER OFFICE AT [phone].  SEEK IMMEDIATE MEDICAL ATTENTION AND CALL THE OFFICE: [phone] IF:  You have more than a spotting of blood in your stool.   Your belly is swollen (abdominal distention).   You are nauseated or vomiting.   You have a temperature over 101F.   You have abdominal pain or discomfort that is severe or gets worse throughout the day.  High-Fiber Diet A high-fiber diet changes your normal diet to include more  whole grains, legumes, fruits, and vegetables. Changes in the diet involve replacing refined carbohydrates with unrefined foods. The calorie level of the diet is essentially unchanged. The Dietary Reference Intake (recommended amount) for adult males is 38 grams per day. For adult females, it is 25 grams per day. Pregnant and lactating women should consume 28 grams of fiber per day. Fiber is the intact part of a plant that is not broken down during digestion. Functional fiber is fiber that has been isolated from the plant to provide a beneficial effect in the body. PURPOSE  Increase stool  bulk.   Ease and regulate bowel movements.   Lower cholesterol.   REDUCE RISK OF COLON CANCER  INDICATIONS THAT YOU NEED MORE FIBER  Constipation and hemorrhoids.   Uncomplicated diverticulosis (intestine condition) and irritable bowel syndrome.   Weight management.   As a protective measure against hardening of the arteries (atherosclerosis), diabetes, and cancer.   GUIDELINES FOR INCREASING FIBER IN THE DIET  Start adding fiber to the diet slowly. A gradual increase of about 5 more grams (2 slices of whole-wheat bread, 2 servings of most fruits or vegetables, or 1 bowl of high-fiber cereal) per day is best. Too rapid an increase in fiber may result in constipation, flatulence, and bloating.   Drink enough water and fluids to keep your urine clear or pale yellow. Water, juice, or caffeine-free drinks are recommended. Not drinking enough fluid may cause constipation.   Eat a variety of high-fiber foods rather than one type of fiber.   Try to increase your intake of fiber through using high-fiber foods rather than fiber pills or supplements that contain small amounts of fiber.   The goal is to change the types of food eaten. Do not supplement your present diet with high-fiber foods, but replace foods in your present diet.   INCLUDE A VARIETY OF FIBER SOURCES  Replace refined and processed grains with whole grains, canned fruits with fresh fruits, and incorporate other fiber sources. White rice, white breads, and most bakery goods contain little or no fiber.   Brown whole-grain rice, buckwheat oats, and many fruits and vegetables are all good sources of fiber. These include: broccoli, Brussels sprouts, cabbage, cauliflower, beets, sweet potatoes, white potatoes (skin on), carrots, tomatoes, eggplant, squash, berries, fresh fruits, and dried fruits.   Cereals appear to be the richest source of fiber. Cereal fiber is found in whole grains and bran. Bran is the fiber-rich outer coat of  cereal grain, which is largely removed in refining. In whole-grain cereals, the bran remains. In breakfast cereals, the largest amount of fiber is found in those with "bran" in their names. The fiber content is sometimes indicated on the label.   You may need to include additional fruits and vegetables each day.   In baking, for 1 cup white flour, you may use the following substitutions:   1 cup whole-wheat flour minus 2 tablespoons.   1/2 cup white flour plus 1/2 cup whole-wheat flour.   Gastritis  Gastritis is an inflammation (the body's way of reacting to injury and/or infection) of the stomach. It is often caused by viral or bacterial (germ) infections. It can also be caused BY ASPIRIN, BC/GOODY POWDER'S, (IBUPROFEN) MOTRIN, OR ALEVE (NAPROXEN), chemicals (including alcohol), SPICY FOODS, and medications. This illness may be associated with generalized malaise (feeling tired, not well), UPPER ABDOMINAL STOMACH cramps, and fever. One common bacterial cause of gastritis is an organism known as H. Pylori. This can be treated with  antibiotics.   Hemorrhoids Hemorrhoids are dilated (enlarged) veins around the rectum. Sometimes clots will form in the veins. This makes them swollen and painful. These are called thrombosed hemorrhoids. Causes of hemorrhoids include:  Constipation.   Straining to have a bowel movement.   HEAVY LIFTING  HOME CARE INSTRUCTIONS  Eat a well balanced diet and drink 6 to 8 glasses of water every day to avoid constipation. You may also use a bulk laxative.   Avoid straining to have bowel movements.   Keep anal area dry and clean.   Do not use a donut shaped pillow or sit on the toilet for long periods. This increases blood pooling and pain.   Move your bowels when your body has the urge; this will require less straining and will decrease pain and pressure.    Diverticulosis Diverticulosis is a common condition that develops when small pouches (diverticula)  form in the wall of the colon. The risk of diverticulosis increases with age. It happens more often in people who eat a low-fiber diet. Most individuals with diverticulosis have no symptoms. Those individuals with symptoms usually experience belly (abdominal) pain, constipation, or loose stools (diarrhea).  HOME CARE INSTRUCTIONS Increase the amount of fiber in your diet as directed by your caregiver or dietician. This may reduce symptoms of diverticulosis.  Drink at least 6 to 8 glasses of water each day to prevent constipation.  Try not to strain when you have a bowel movement.   FOODS HAVING HIGH FIBER CONTENT INCLUDE: Fruits. Apple, peach, pear, tangerine, raisins, prunes.  Vegetables. Brussels sprouts, asparagus, broccoli, cabbage, carrot, cauliflower, romaine lettuce, spinach, summer squash, tomato, winter squash, zucchini.  Starchy Vegetables. Baked beans, kidney beans, lima beans, split peas, lentils, potatoes (with skin).  Grains. Whole wheat bread, brown rice, bran flake cereal, plain oatmeal, white rice, shredded wheat, bran muffins.   SEEK IMMEDIATE MEDICAL CARE IF: You develop increasing pain or severe bloating.  You have an oral temperature above 101F.  You develop vomiting or bowel movements that are bloody or black.

## 2016-12-07 ENCOUNTER — Other Ambulatory Visit: Payer: Self-pay

## 2016-12-07 ENCOUNTER — Encounter: Payer: Self-pay | Admitting: Gastroenterology

## 2016-12-07 DIAGNOSIS — D509 Iron deficiency anemia, unspecified: Secondary | ICD-10-CM

## 2016-12-07 NOTE — Progress Notes (Signed)
APPT MADE AND ON RECALL  °

## 2016-12-07 NOTE — Progress Notes (Signed)
PT is aware. OK to schedule the Given's. Pt is aware lab order on file in [facility].  [facility].

## 2016-12-10 ENCOUNTER — Encounter: Payer: Self-pay | Admitting: Gastroenterology

## 2016-12-17 ENCOUNTER — Encounter: Payer: Self-pay | Admitting: Gastroenterology

## 2016-12-18 ENCOUNTER — Telehealth: Payer: Self-pay

## 2016-12-18 NOTE — Telephone Encounter (Signed)
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 Talked with AB and she advised that he could work that day and he will do clear liquids for about 4 hours after capsule and he would receive info at the hospital. AB wants to know about his pain at hernia repair site.  Called his wife and informed her of AB recommendations. She asked about his BP med the morning of Givens. Advised her he could take meds 2 hours before test with sip of water. Asked wife about his pain. She states he is not having any other symptoms and the pain comes and goes. Pt is at work and she will try to contact him about the severity and call the office. Advised her AB can order imaging if needed.

## 2016-12-21 ENCOUNTER — Other Ambulatory Visit: Payer: Self-pay | Admitting: Gastroenterology

## 2016-12-21 ENCOUNTER — Telehealth: Payer: Self-pay | Admitting: Gastroenterology

## 2016-12-21 ENCOUNTER — Other Ambulatory Visit: Payer: Self-pay

## 2016-12-21 DIAGNOSIS — D508 Other iron deficiency anemias: Secondary | ICD-10-CM

## 2016-12-21 DIAGNOSIS — R109 Unspecified abdominal pain: Secondary | ICD-10-CM

## 2016-12-21 NOTE — Telephone Encounter (Signed)
Pt called this morning asking for AB. I told him that she was rounding at the hospital. He said that AB wanted to talk with him about scheduling a CT and wanted to talk with him personally about it first. Please call hm at [phone]

## 2016-12-21 NOTE — Telephone Encounter (Signed)
Troy Bastosnna, do you want CT abd/pelvis? Also do you want with contrast? Please advise.

## 2016-12-21 NOTE — Telephone Encounter (Signed)
Spoke with patient regarding abdominal discomfort. He notes a "pin prick above his umbilicus" sensation of something "sticking into me", comes and goes on its own without any precipitating or relieving factors. States "something is not right". No associated N/V. Desires CT scan. Denies any bulging, hernias, etc.    I spoke with radiology, and there is no contraindication to having a capsule placed then completing a CT scan.    Can we arrange a CT later in the afternoon on 2/7 for patient? He will be having his capsule study done that day. He is trying to get all of this done at once. If we can't do 2/7, then sometime later in the week is fine as well. He said to call him at [phone].

## 2016-12-21 NOTE — Telephone Encounter (Signed)
PA info submitted via [facility] website for CT abd/pelvis with contrast. Case is "pending". Faxed additional info to [facility].

## 2016-12-21 NOTE — Telephone Encounter (Signed)
CT abd/pelvis with contrast scheduled for 12/28/16 at 4:00pm, [facility]. NPO 4 hours prior to test. Pt to pick-up contrast at radiology dept prior to test. Called and informed pt.

## 2016-12-21 NOTE — Telephone Encounter (Signed)
Hi!  Yes, Ct abd/pelvis with contrast.

## 2016-12-22 ENCOUNTER — Telehealth: Payer: Self-pay | Admitting: Gastroenterology

## 2016-12-22 LAB — CBC
HEMATOCRIT: 31.1 % — AB (ref 38.5–50.0)
HEMOGLOBIN: 8.9 g/dL — AB (ref 13.2–17.1)
MCH: 19.6 pg — ABNORMAL LOW (ref 27.0–33.0)
MCHC: 28.6 g/dL — ABNORMAL LOW (ref 32.0–36.0)
MCV: 68.7 fL — AB (ref 80.0–100.0)
MPV: 9.4 fL (ref 7.5–12.5)
Platelets: 349 10*3/uL (ref 140–400)
RBC: 4.53 MIL/uL (ref 4.20–5.80)
RDW: 18.2 % — ABNORMAL HIGH (ref 11.0–15.0)
WBC: 5.1 10*3/uL (ref 3.8–10.8)

## 2016-12-22 NOTE — Telephone Encounter (Signed)
[facility]

## 2016-12-23 ENCOUNTER — Ambulatory Visit (HOSPITAL_COMMUNITY)
Admission: RE | Admit: 2016-12-23 | Discharge: 2016-12-23 | Disposition: A | Discharge status: Home / Self Care | Payer: Managed Care, Other (non HMO) | Source: Ambulatory Visit | Attending: Gastroenterology | Admitting: Gastroenterology

## 2016-12-23 ENCOUNTER — Encounter (HOSPITAL_COMMUNITY)
Admission: RE | Disposition: A | Discharge status: Home / Self Care | Payer: Self-pay | Source: Ambulatory Visit | Attending: Gastroenterology

## 2016-12-23 ENCOUNTER — Encounter (HOSPITAL_COMMUNITY): Payer: Self-pay | Admitting: *Deleted

## 2016-12-23 DIAGNOSIS — D509 Iron deficiency anemia, unspecified: Secondary | ICD-10-CM

## 2016-12-23 HISTORY — PX: GIVENS CAPSULE STUDY: SHX5432

## 2016-12-23 SURGERY — IMAGING PROCEDURE, GI TRACT, INTRALUMINAL, VIA CAPSULE

## 2016-12-23 MED ORDER — SODIUM CHLORIDE 0.9 % IV SOLN: INTRAVENOUS | Status: DC

## 2016-12-23 NOTE — Telephone Encounter (Signed)
Case is still pending. Called [facility] to follow-up. They did receive faxed clinical info and a decision should be made by the end of today.

## 2016-12-23 NOTE — Telephone Encounter (Signed)
Anna, please advise!

## 2016-12-24 ENCOUNTER — Encounter (HOSPITAL_COMMUNITY): Payer: Self-pay | Admitting: Gastroenterology

## 2016-12-24 ENCOUNTER — Telehealth: Payer: Self-pay | Admitting: Gastroenterology

## 2016-12-24 NOTE — Telephone Encounter (Signed)
Pt is aware that Lewie LoronAnna Boone, NP is off this afternoon, and we normally call within 7-10 business days. Told him I will let her know he has inquired about the test. He would like a phone call from her tomorrow if possible.

## 2016-12-24 NOTE — Telephone Encounter (Signed)
PA# for CT abd/pelvis with contrast: [id number].

## 2016-12-24 NOTE — Telephone Encounter (Signed)
Pt called asking to speak to AB. I told him AB was with patients at the moment. Pt was wanting his results from where he had to swallow the pill. I told him that the nurse would call him with results when available. [phone]

## 2016-12-24 NOTE — Telephone Encounter (Signed)
I don't have this on my schedule to read. Is Dr. Darrick PennaFields reading it? Agree, will be available in time frame as per office protocol.

## 2016-12-25 NOTE — Telephone Encounter (Signed)
Pt is aware.  

## 2016-12-25 NOTE — Telephone Encounter (Signed)
PT is aware. He wants to know if he still needs to get the CT as scheduled for Monday. Also, he wants to know, since he had been off of the iron pills for a week for the other test, does he REALLY have to have iron infusion.  Tobi Bastosnna, Please advise!

## 2016-12-25 NOTE — Telephone Encounter (Signed)
Forwarding to Dr. Darrick PennaFields to advise if she is reading it.

## 2016-12-25 NOTE — Telephone Encounter (Signed)
PLEASE CALL PT. HIS RESULTS WILL BE AVAILABLE BY FEB 14.

## 2016-12-25 NOTE — Telephone Encounter (Signed)
He wants to wait on iron infusion and continue taking oral pills. If he feels symptomatic, he will call. He is feeling better now since restarting iron.

## 2016-12-25 NOTE — Telephone Encounter (Signed)
He recently had a CBC earlier this week at his request. It had drifted some. Labs from December from PCP with ferritin 4, iron 14. With his Hgb drifting some, I had sent a message in MyChart about proceeding with iron infusion. As his Hgb has drifted a bit, let's go ahead and set this up if he is willing. He would then need labs (CBC, iron, ferritin ) in 4 weeks and likely another infusion. Would do one dose of feraheme. So, labs could still be done in March (CBC, iron, ferritin) but would need to be 4 weeks after.

## 2016-12-28 ENCOUNTER — Other Ambulatory Visit: Payer: Self-pay

## 2016-12-28 ENCOUNTER — Ambulatory Visit (HOSPITAL_COMMUNITY)
Admission: RE | Admit: 2016-12-28 | Discharge: 2016-12-28 | Disposition: A | Discharge status: Home / Self Care | Payer: No Typology Code available for payment source | Source: Ambulatory Visit | Attending: Gastroenterology | Admitting: Gastroenterology

## 2016-12-28 DIAGNOSIS — K449 Diaphragmatic hernia without obstruction or gangrene: Secondary | ICD-10-CM | POA: Diagnosis not present

## 2016-12-28 DIAGNOSIS — K4090 Unilateral inguinal hernia, without obstruction or gangrene, not specified as recurrent: Secondary | ICD-10-CM | POA: Insufficient documentation

## 2016-12-28 DIAGNOSIS — I700 Atherosclerosis of aorta: Secondary | ICD-10-CM | POA: Diagnosis not present

## 2016-12-28 DIAGNOSIS — R109 Unspecified abdominal pain: Secondary | ICD-10-CM | POA: Insufficient documentation

## 2016-12-28 DIAGNOSIS — D509 Iron deficiency anemia, unspecified: Secondary | ICD-10-CM

## 2016-12-28 MED ORDER — IOPAMIDOL (ISOVUE-300) INJECTION 61%
100.0000 mL | Freq: Once | INTRAVENOUS | Status: AC | PRN
  Administered 2016-12-28: 100 mL via INTRAVENOUS

## 2016-12-28 NOTE — Telephone Encounter (Signed)
Noted  

## 2016-12-30 ENCOUNTER — Telehealth: Payer: Self-pay | Admitting: Gastroenterology

## 2016-12-30 NOTE — Telephone Encounter (Signed)
PLEASE CALL PT. His CAPSULE STUDY SHOWS Arteriovenous Malformation(AVMs) A SUPERFICIAL COLLECTION OF BLOOD VESSELS ON THE SURFACE OF THE SMALL BOWEL THAT CAUSE YOU TO LOSE BLOOD BUT YOU DO NOT SEE ANYTHING COME OUT.  An AVM may occur in the STOMACH, COLON, OR SMALL BOWEL.  HIS LAST BLOOD COUNT WAS ABNORMAL.  HE SHOULD ONLY USE ASPIRIN, BC/GOODY POWDERS, IBUPROFEN/MOTRIN, OR NAPROXEN/ALEVE IF MEDICALLY NECESSARY.  HE CAN CONTINUE ORAL IRON BUT HE SHOULD CONSIDER HEMATOLOGY REFERRAL FOR IV IRON INFUSION.  OPV  MAY 2018 30 ANEMIA/SMALL BOWEL AVMs.

## 2016-12-30 NOTE — Telephone Encounter (Signed)
I spoke to pt and he asked again about the Givens and why it took so long for results. I explained to him that Dr. Darrick PennaFields had to read the report and told him that I had informed him that she would have results for him on 12/30/2016  HE ALSO HAD A COMPLAINT, BECAUSE HE SHAVED AROUND HIS BELLY 2 AND 1/2 INCHES PER THE INSTRUCTION SHEET THAT HE WAS GIVEN. HE WAS TOLD AT [facility] THAT THEY HAVE NOT DONE THAT IN A LONG TIME, THEY NO LONGER USE THE ELECTRODES.  I TOLD HIM THAT I WOULD LET MY OFFICE MANAGER KNOW SO WE COULD UPDATE THE INSTRUCTION SHEET.

## 2016-12-30 NOTE — Telephone Encounter (Signed)
Reviewed

## 2016-12-30 NOTE — Procedures (Addendum)
  PATIENT DATA: GASTRIC PASSAGE TIME: 27 m, SB PASSAGE TIME: 2H 835m  RESULTS: LIMITED views of gastric mucosa due to retained contents.  No ULCERS,or masses seen.  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 LIMITED VIEWS OF THE COLON DUE TO RETAINED CONTENTS. No old blood or fresh blood in the stomach, small bowel, or colon.  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 CONSIDER HEMATOLOGY REFERRAL FOR IV FE. CONTINUE PO IRON. 2. OPV IN 3 MOS E30 MICROCYTIC ANEMIA/AVMs.

## 2016-12-30 NOTE — Telephone Encounter (Addendum)
PLEASE CALL PT. THE OP NOTE WAS SHARED WITH PATIENT.

## 2016-12-30 NOTE — Telephone Encounter (Signed)
LMOM to call.

## 2016-12-30 NOTE — Telephone Encounter (Signed)
I called pt and he is aware of the results. He would like for me to try to [facility] tomorrow sometime so his wife can read. He will let us know if he would like to be referred to the Hematologist.

## 2016-12-30 NOTE — Telephone Encounter (Signed)
Pt called wanting his results. He asked if anyone was going to get back with him about his results. I told him it could take 7-10 business days and the nurse would contact him. (pt also has mychart). He said that he has done his part and that we needed to do our part and hung up.

## 2016-12-31 NOTE — Telephone Encounter (Signed)
LATE ENTRY:  I went over these results with pt yesterday about 5:00 pm. He would like for me to put in My Chart this Am and send to him so his wife can review.

## 2016-12-31 NOTE — Telephone Encounter (Signed)
I HAVE COPIED AND PASTED AND SENT TO PT'S CHART.

## 2017-02-01 ENCOUNTER — Ambulatory Visit: Payer: Managed Care, Other (non HMO) | Admitting: Cardiology

## 2017-02-02 ENCOUNTER — Other Ambulatory Visit: Payer: Self-pay

## 2017-02-02 ENCOUNTER — Telehealth: Payer: Self-pay | Admitting: Gastroenterology

## 2017-02-02 DIAGNOSIS — D509 Iron deficiency anemia, unspecified: Secondary | ICD-10-CM

## 2017-02-02 NOTE — Telephone Encounter (Signed)
Orders have been entered 

## 2017-02-02 NOTE — Telephone Encounter (Signed)
PATIENT CALLED INQUIRING IF YOU COULD SEND HIM FOR LABS. HEMOGLOBIN, YOU CAN CALL HIM ON HIS CELL PHONE

## 2017-02-02 NOTE — Telephone Encounter (Signed)
Please order CBC, ferritin, TIBC to be done now. Patient can go when he is able.

## 2017-02-03 LAB — FERRITIN: FERRITIN: 4 ng/mL — AB (ref 20–380)

## 2017-02-03 NOTE — Telephone Encounter (Signed)
LMOM for pt that labs have been ordered and [facility].

## 2017-02-04 LAB — CBC WITH DIFFERENTIAL/PLATELET
BASOS ABS: 48 {cells}/uL (ref 0–200)
Basophils Relative: 1 %
EOS PCT: 3 %
Eosinophils Absolute: 144 cells/uL (ref 15–500)
HCT: 36 % — ABNORMAL LOW (ref 38.5–50.0)
Hemoglobin: 10.4 g/dL — ABNORMAL LOW (ref 13.2–17.1)
LYMPHS ABS: 1392 {cells}/uL (ref 850–3900)
Lymphocytes Relative: 29 %
MCH: 19.7 pg — ABNORMAL LOW (ref 27.0–33.0)
MCHC: 29.3 g/dL — AB (ref 32.0–36.0)
MCV: 68.1 fL — ABNORMAL LOW (ref 80.0–100.0)
MONOS PCT: 9 %
MPV: 9.3 fL (ref 7.5–12.5)
Monocytes Absolute: 432 cells/uL (ref 200–950)
NEUTROS ABS: 2784 {cells}/uL (ref 1500–7800)
NEUTROS PCT: 58 %
PLATELETS: 370 10*3/uL (ref 140–400)
RBC: 5.29 MIL/uL (ref 4.20–5.80)
RDW: 18.4 % — ABNORMAL HIGH (ref 11.0–15.0)
WBC: 4.8 10*3/uL (ref 3.8–10.8)

## 2017-02-04 LAB — IRON AND TIBC
%SAT: 2 % — ABNORMAL LOW (ref 15–60)
IRON: 13 ug/dL — AB (ref 50–180)
TIBC: 544 ug/dL — ABNORMAL HIGH (ref 250–425)
UIBC: 531 ug/dL — ABNORMAL HIGH (ref 125–400)

## 2017-02-08 ENCOUNTER — Encounter: Payer: Self-pay | Admitting: Gastroenterology

## 2017-02-09 ENCOUNTER — Other Ambulatory Visit: Payer: Self-pay

## 2017-02-09 DIAGNOSIS — D509 Iron deficiency anemia, unspecified: Secondary | ICD-10-CM

## 2017-02-09 NOTE — Progress Notes (Signed)
LMOM to call.

## 2017-02-09 NOTE — Progress Notes (Signed)
Pt is aware.  

## 2017-02-24 ENCOUNTER — Encounter (HOSPITAL_COMMUNITY): Payer: Self-pay | Admitting: Oncology

## 2017-02-24 ENCOUNTER — Encounter: Payer: Self-pay | Admitting: Gastroenterology

## 2017-02-24 ENCOUNTER — Ambulatory Visit (HOSPITAL_COMMUNITY): Payer: No Typology Code available for payment source | Admitting: Oncology

## 2017-02-24 NOTE — Assessment & Plan Note (Addendum)
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 Darrick Penna.  Labs today: CBC diff, BMET, anemia panel, retic count, haptoglobin, EPO level, LDH, pathology smear review.  Labs from 02/02/2017 are reviewed with the patient: CBC, iron/TIBC, ferritin.  I personally reviewed and went over laboratory results with the patient.  The results are noted within this dictation.  I personally reviewed and went over radiographic studies with the patient.  The results are noted within this dictation.  CT abd/pelvis performed in Feb 2018 was negative for any hem/onc issues.  Based upon labs from 02/02/2017, calculated iron deficit is 720 mg.  Therefore, we will get him set-up for 1 dose of Injectafer in the near future.  Supportive therapy plan is built accordingly.  I have reviewed the risks, benefits, alternatives, and side effects of Injectafer (ferric carboxymaltose) including, but not limited to, reaction at injection site, anaphylaxis, back pain, hypophosphatemia, increase in blood pressure, flushing, hypotension, skin discoloration at injection site, nausea, vomiting.  He is provided patient education regarding iron deficiency anemia.    Iron deficiency anemia is the most common anemia.  Beside playing a critical role as an oxygen carrier in the heme group of hemoglobin, iron is found in many key proteins in the cells, such as cytochromes and myoglobin, so it is not unexpected that a lack of iron has effects other than anemia.  Three studies have focused on nonanemic iron deficiency leading to fatigue.  Two studies showed that oral iron supplementation reduces fatigue, with no significant change in hemoglobin levels, in women with a ferritin level of less than 50 ng/mL, and a third study showed a lessening of fatigue with parental iron administration in women with a ferritin level  of 15 ng/mL or less or an iron saturation of 20% or less.   Owing to obligate iron loss through menses, women are at greater risk for iron deficiency than men.  Iron loss in all women averages 1-3 ng per day, and dietary intake is often inadequate to maintain a positive iron balance.  A 1967 study showed that 25% of healthy, college-age women had no bone marrow iron stores and that another 33% had low stores.  Pregnancy adds to demands for iron, with requirements increasing to 6 ng per day by the end of pregnancy.  Athletes are another group at risk for iron deficiency.  Gastrointestinal tract blood is the source of iron loss, and exercise-induced hemolysis leads to urinary iron losses.  Decreased absorption of iron has also been implicated as a cause of iron deficiency, because of levels of hepcidin are often elevated in athletes owing to training-induced inflammation.    Obesity and its surgical treatment are also at risk factors for iron deficiency.  Obese patients are often iron-deficient, with increased hepcidin level being implicated in decreased absorption.  After bariatric surgery, the incidence of iron deficiency can be as high as 50%.  Because the main site of iron absorption is the duodenum, surgeries that involve bypassing this part of the bowel are associated with an increased incidence of iron deficiency.  However, iron deficiency is seen as a sequela of most types of bariatric surgery.    -NEJM Volume 371, No 14, pg [id number]  Labs in 6 weeks: CBC diff, iron/TIBC, ferritin.  I have given an Rx for B-complex vitamin and Folate for 2 months to support expected increase in hematopoeisis  following IV iron replacement.  Return in 6 weeks for follow-up.  Patient is educated that as iron replacement therapy is used to produce RBCs, his iron levels may drop again.  Therefore, he may need another dose of IV iron therapy.

## 2017-02-24 NOTE — Progress Notes (Signed)
This encounter was created in error - please disregard.

## 2017-03-08 ENCOUNTER — Encounter: Payer: Self-pay | Admitting: Gastroenterology

## 2017-03-08 ENCOUNTER — Ambulatory Visit: Payer: Managed Care, Other (non HMO) | Admitting: Gastroenterology

## 2017-03-09 ENCOUNTER — Other Ambulatory Visit (HOSPITAL_COMMUNITY): Payer: Self-pay | Admitting: Oncology

## 2017-03-09 ENCOUNTER — Encounter (HOSPITAL_COMMUNITY): Payer: 59 | Attending: Oncology | Admitting: Oncology

## 2017-03-09 ENCOUNTER — Encounter (HOSPITAL_COMMUNITY): Payer: 59

## 2017-03-09 ENCOUNTER — Ambulatory Visit (HOSPITAL_COMMUNITY): Payer: No Typology Code available for payment source

## 2017-03-09 ENCOUNTER — Encounter (HOSPITAL_COMMUNITY): Payer: Self-pay

## 2017-03-09 VITALS — BP 174/93 | HR 83 | Temp 98.7°F | Resp 20 | Ht 73.5 in | Wt 208.5 lb

## 2017-03-09 DIAGNOSIS — R7989 Other specified abnormal findings of blood chemistry: Secondary | ICD-10-CM | POA: Diagnosis not present

## 2017-03-09 DIAGNOSIS — D500 Iron deficiency anemia secondary to blood loss (chronic): Secondary | ICD-10-CM | POA: Diagnosis not present

## 2017-03-09 LAB — CBC WITH DIFFERENTIAL/PLATELET
BASOS PCT: 1 %
Basophils Absolute: 0 10*3/uL (ref 0.0–0.1)
EOS ABS: 0.2 10*3/uL (ref 0.0–0.7)
Eosinophils Relative: 5 %
HCT: 36.1 % — ABNORMAL LOW (ref 39.0–52.0)
Hemoglobin: 10.5 g/dL — ABNORMAL LOW (ref 13.0–17.0)
Lymphocytes Relative: 32 %
Lymphs Abs: 1.3 10*3/uL (ref 0.7–4.0)
MCH: 20.6 pg — ABNORMAL LOW (ref 26.0–34.0)
MCHC: 29.1 g/dL — AB (ref 30.0–36.0)
MCV: 70.8 fL — ABNORMAL LOW (ref 78.0–100.0)
MONO ABS: 0.3 10*3/uL (ref 0.1–1.0)
MONOS PCT: 8 %
NEUTROS PCT: 55 %
Neutro Abs: 2.2 10*3/uL (ref 1.7–7.7)
PLATELETS: 314 10*3/uL (ref 150–400)
RBC: 5.1 MIL/uL (ref 4.22–5.81)
RDW: 20.2 % — AB (ref 11.5–15.5)
WBC: 4.1 10*3/uL (ref 4.0–10.5)

## 2017-03-09 LAB — BASIC METABOLIC PANEL
Anion gap: 8 (ref 5–15)
BUN: 18 mg/dL (ref 6–20)
CALCIUM: 9.3 mg/dL (ref 8.9–10.3)
CHLORIDE: 104 mmol/L (ref 101–111)
CO2: 27 mmol/L (ref 22–32)
CREATININE: 0.96 mg/dL (ref 0.61–1.24)
GFR calc Af Amer: >60 mL/min (ref >60)
GFR calc non Af Amer: >60 mL/min (ref >60)
GLUCOSE: 130 mg/dL — AB (ref 65–99)
Potassium: 4.2 mmol/L (ref 3.5–5.1)
SODIUM: 139 mmol/L (ref 135–145)

## 2017-03-09 LAB — IRON AND TIBC
Iron: 28 ug/dL — ABNORMAL LOW (ref 45–182)
SATURATION RATIOS: 5 % — AB (ref 17.9–39.5)
TIBC: 598 ug/dL — ABNORMAL HIGH (ref 250–450)
UIBC: 570 ug/dL

## 2017-03-09 LAB — VITAMIN B12: Vitamin B-12: 201 pg/mL (ref 180–914)

## 2017-03-09 LAB — RETICULOCYTES
RBC.: 5.17 MIL/uL (ref 4.22–5.81)
Retic Count, Absolute: 56.9 10*3/uL (ref 19.0–186.0)
Retic Ct Pct: 1.1 % (ref 0.4–3.1)

## 2017-03-09 LAB — FERRITIN: Ferritin: 4 ng/mL — ABNORMAL LOW (ref 24–336)

## 2017-03-09 LAB — LACTATE DEHYDROGENASE: LDH: 111 U/L (ref 98–192)

## 2017-03-09 LAB — FOLATE: Folate: 44.2 ng/mL (ref >5.9)

## 2017-03-09 NOTE — Progress Notes (Signed)
[facility]  CONSULT NOTE  Patient Care Team: Troy Morgan, MD as PCP - General (Family Medicine)  CHIEF COMPLAINTS/PURPOSE OF CONSULTATION:  Iron deficiency anemia from chronic blood loss  HISTORY OF PRESENTING ILLNESS:  Troy [name] 56 y.o. male is here because of referral by Troy [name] for anemia. He has a past medical history significant for GERD, HTN, and iron deficiency anemia.   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  Lab results from 02/02/2017 demonstrated hemoglobin of 10.4 g/dL, HCT 60%, MCV 45.4 fL,  MCH 19.7 pg, and platelet count of 370K, ferritin 4, serum iron 13.   Troy [name] presents to the clinic today for evaluation and treatment for his iron deficiency anemia. I personally reviewed and went over labs with the patient.  He states that he is depressed, can't sleep, quit his job of 10 years, and drinks a 6-pack of beer everyday. He notes he uses alcohol as his therapy when he wants to relax or sleep. He states that he hates taking antidepressants because of how it makes him feel and act. The patient acknowledges that drinking is not good for his health.   He states he normally takes oral iron once a day. Sometimes he takes two a day when he can remember. He also takes medicine for acid reflux and hypertension.   Patient states he has noticed black stools occasionally. However he also has hemorrhoids and notices fresh blood on a regular basis when he gets constipated and has to strain. He notes that he often feels tired and weak. He states he has intermittent bilateral leg cramps.   Denies chest pain, SOB, abdominal pain, focal weakness, recent infections.      MEDICAL HISTORY:  Past Medical History:  Diagnosis Date  . Alcohol dependence (HCC)   . Anxiety   . Chest pain   . GERD  (gastroesophageal reflux disease)   . Hypertension   . Insomnia   . Iron deficiency anemia 11/25/2016  . Plantar fascial fibromatosis   . Psoriasis     SURGICAL HISTORY: Past Surgical History:  Procedure Laterality Date  . BIOPSY  12/01/2016   Procedure: BIOPSY;  Surgeon: Troy Bali, MD;  [facility];  Service: Endoscopy;;  gastric duodenal hiatal hernia pouch  . COLONOSCOPY WITH PROPOFOL N/A 12/01/2016   Procedure: COLONOSCOPY WITH PROPOFOL;  Surgeon: Troy Bali, MD;  [facility];  Service: Endoscopy;  Laterality: N/A;  1:45 pm  . ESOPHAGOGASTRODUODENOSCOPY  2004   Dr. 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 ESOPHAGOGASTRODUODENOSCOPY (EGD) WITH PROPOFOL N/A 12/01/2016   Procedure: ESOPHAGOGASTRODUODENOSCOPY (EGD) WITH PROPOFOL;  Surgeon: Troy Bali, MD;  [facility];  Service: Endoscopy;  Laterality: N/A;  . GIVENS CAPSULE STUDY N/A 12/23/2016   Procedure: GIVENS CAPSULE STUDY;  Surgeon: Troy Bali, MD;  [facility];  Service: Endoscopy;  Laterality: N/A;  arrive at 7:00am for 7:30am appt time  . HEMORRHOID SURGERY     residual skin tags  . right ankle    . UMBILICAL HERNIA REPAIR N/A 06/07/2014   Procedure: HERNIA REPAIR UMBILICAL ADULT;  Surgeon: Marlane Hatcher, MD;  Location: [facility];  Service: General;  Laterality: N/A;    SOCIAL HISTORY: Social History   Social History  . Marital status: Married    Spouse name: N/A  .  Number of children: N/A  . Years of education: N/A   Occupational History  . Not on file.   Social History Main Topics  . Smoking status: Never Smoker  . Smokeless tobacco: Never Used  . Alcohol use Yes     Comment: 6-12 beers at a time sometimes daily.   . Drug use: No  . Sexual activity: Yes   Other Topics Concern  . Not on file   Social History Narrative  . No narrative on file    FAMILY HISTORY: Family History  Problem Relation Age of Onset  . Heart  disease Mother     stent placement  . Heart attack Father     died from MR age 76  . Heart attack Maternal Grandmother   . Colon cancer Paternal Grandfather     ALLERGIES:  has No Known Allergies.  MEDICATIONS:  Current Outpatient Prescriptions  Medication Sig Dispense Refill  . lisinopril (PRINIVIL,ZESTRIL) 20 MG tablet Take 10 mg by mouth daily.     Troy Kitchen omeprazole (PRILOSEC) 20 MG capsule 1 PO 30 mins prior to breakfast 30 capsule 11  . Polysacch Fe Cmp-Fe Heme Poly (BIFERA) 28 MG TABS 1 po bid 60 tablet 11   No current facility-administered medications for this visit.     Review of Systems  HENT: Negative.   Eyes: Negative.   Respiratory: Negative.   Cardiovascular: Negative.  Negative for chest pain.  Gastrointestinal: Positive for melena (occasional).  Genitourinary: Negative.   Musculoskeletal: Negative.        Intermittent bilateral leg cramps.   Skin: Negative.   Neurological: Positive for weakness.  Endo/Heme/Allergies: Negative.   Psychiatric/Behavioral: Positive for depression and substance abuse (alcohol, drinks a 6-pack everyday). The patient has insomnia.   All other systems reviewed and are negative.  14 point ROS was done and is otherwise as detailed above or in HPI   PHYSICAL EXAMINATION:  Vitals:   03/09/17 1415  BP: (!) 174/93  Pulse: 83  Resp: 20  Temp: 98.7 F (37.1 C)   Filed Weights   03/09/17 1415  Weight: 208 lb 8 oz (94.6 kg)     Physical Exam  Constitutional: He is oriented to person, place, and time and well-developed, well-nourished, and in no distress.  HENT:  Head: Normocephalic and atraumatic.  Eyes: Conjunctivae and EOM are normal. Pupils are equal, round, and reactive to light.  Neck: Normal range of motion. Neck supple.  Cardiovascular: Normal rate, regular rhythm and normal heart sounds.   Pulmonary/Chest: Effort normal and breath sounds normal.  Abdominal: Soft. Bowel sounds are normal.  Musculoskeletal: Normal range of  motion.  Neurological: He is alert and oriented to person, place, and time. Gait normal.  Skin: Skin is warm and dry.  Nursing note and vitals reviewed.     LABORATORY DATA:  I have reviewed the data as listed Lab Results  Component Value Date   WBC 4.8 02/02/2017   HGB 10.4 (L) 02/02/2017   HCT 36.0 (L) 02/02/2017   MCV 68.1 (L) 02/02/2017   PLT 370 02/02/2017   CMP     Component Value Date/Time   NA 139 11/27/2016 1248   K 3.8 11/27/2016 1248   CL 106 11/27/2016 1248   CO2 25 11/27/2016 1248   GLUCOSE 145 (H) 11/27/2016 1248   BUN 13 11/27/2016 1248   CREATININE 0.96 11/27/2016 1248   CALCIUM 8.7 (L) 11/27/2016 1248   GFRNONAA >60 11/27/2016 1248   GFRAA >60 11/27/2016 1248  Results for ARVO, EALY (MRN [id number]) as of 03/09/2017 13:28  Ref. Range 02/02/2017 16:14  Iron Latest Ref Range: 50 - 180 ug/dL 13 (L)  UIBC Latest Ref Range: 125 - 400 ug/dL 578 (H)  TIBC Latest Ref Range: 250 - 425 ug/dL 469 (H)  %SAT Latest Ref Range: 15 - 60 % 2 (L)  Ferritin Latest Ref Range: 20 - 380 ng/mL 4 (L)     RADIOGRAPHIC STUDIES: I have personally reviewed the radiological images as listed and agreed with the findings in the report. No results found.  CT ABDOMEN AND PELVIS WITH CONTRAST 12/28/2016  IMPRESSION: No acute abnormality or finding to explain the patient's symptoms.  Small hiatal hernia.  Atherosclerosis.  Small fat containing left inguinal hernia.   ASSESSMENT:  Iron deficiency anemia in the setting of chronic GI blood loss secondary to AVM and Cameron's erosions/ulcers on EGD/Colonoscopy on 12/01/2016 and Camera capsule study on 12/23/2016 by Troy [name].  PLAN:   Labs today: CBC diff, CMP, anemia panel, retic count, haptoglobin, EPO level, LDH, pathology smear review.  Labs from 02/02/2017 are reviewed with the patient: CBC, iron/TIBC, ferritin.  I personally reviewed and went over laboratory results with the patient.    Repeat CBC from today  shows a hemoglobin of 10.5 g/dl which is not changed from one month ago with persistent microcytosis despite patient taking oral iron.  I will plan to give him 2 doses of feraheme. I have gone over administration, side affects, dosing of feraheme and patient is agreeable to proceed. Supportive therapy plan is built accordingly.  RTC in 8 weeks for follow up with repeat labs below to assess response to IV iron.   ORDERS PLACED FOR THIS ENCOUNTER: Orders Placed This Encounter  Procedures  . CBC with Differential  . Comprehensive metabolic panel  . Iron and TIBC  . Ferritin    All questions were answered. The patient knows to call the clinic with any problems, questions or concerns.  This document serves as a record of services personally performed by Ralene Cork, MD. It was created on her behalf by Theron Arista, a trained medical scribe. The creation of this record is based on the scribe's personal observations and the provider's statements to them. This document has been checked and approved by the attending provider.  I have reviewed the above documentation for accuracy and completeness and I agree with the above.  This note was electronically signed.    Phill Mutter  03/09/2017 2:25 PM

## 2017-03-09 NOTE — Patient Instructions (Signed)
[facility] at [facility] Discharge Instructions  RECOMMENDATIONS MADE BY THE CONSULTANT AND ANY TEST RESULTS WILL BE SENT TO YOUR REFERRING PHYSICIAN.  You were seen today by Dr. Ralene Cork We will schedule you for 2 doses of iron  Lab work today and we will call you with results Follow up in 8 weeks with lab work   Thank you for choosing [facility] at [facility] to provide your oncology and hematology care.  To afford each patient quality time with our provider, please arrive at least 15 minutes before your scheduled appointment time.    If you have a lab appointment with the Cancer Center please come in thru the  Main Entrance and check in at the main information desk  You need to re-schedule your appointment should you arrive 10 or more minutes late.  We strive to give you quality time with our providers, and arriving late affects you and other patients whose appointments are after yours.  Also, if you no show three or more times for appointments you may be dismissed from the clinic at the providers discretion.     Again, thank you for choosing [facility].  Our hope is that these requests will decrease the amount of time that you wait before being seen by our physicians.       _____________________________________________________________  Should you have questions after your visit to [facility], please contact our office at [phone] between the hours of 8:30 a.m. and 4:30 p.m.  Voicemails left after 4:30 p.m. will not be returned until the following business day.  For prescription refill requests, have your pharmacy contact our office.       Resources For Cancer Patients and their Caregivers ? American Cancer Society: Can assist with transportation, wigs, general needs, runs Look Good Feel Better.        ([phone] 665-2875 ? Cancer Care: Provides financial assistance, online support groups,  medication/co-pay assistance.  1-800-813-HOPE 989-006-6660) ? [facility] Assists [place] cancer patients and their families through emotional , educational and financial support.  619-377-9561 ? [place] DSS Where to apply for food stamps, Medicaid and utility assistance. 930-544-5974 ? [employer]: Transportation to medical appointments. 914-804-9821 ? Social Security Administration: May apply for disability if have a Stage IV cancer. 512-393-6611 6027972537 ? [employer], Disability and Transit Services: Assists with nutrition, care and transit needs. 208-135-0036  Cancer Center Support Programs: @ > Cancer Support Group  2nd Tuesday of the month 1pm-2pm, Journey Room  > Creative Journey  3rd Tuesday of the month 1130am-1pm, Journey Room  > Look Good Feel Better  1st Wednesday of the month 10am-12 noon, Journey Room (Call American Cancer Society to register [phone])

## 2017-03-10 ENCOUNTER — Other Ambulatory Visit (HOSPITAL_COMMUNITY): Payer: Self-pay | Admitting: Oncology

## 2017-03-10 DIAGNOSIS — E538 Deficiency of other specified B group vitamins: Secondary | ICD-10-CM | POA: Insufficient documentation

## 2017-03-10 LAB — HAPTOGLOBIN: HAPTOGLOBIN: 201 mg/dL — AB (ref 34–200)

## 2017-03-10 LAB — ERYTHROPOIETIN: ERYTHROPOIETIN: 18.5 m[IU]/mL (ref 2.6–18.5)

## 2017-03-11 ENCOUNTER — Encounter (HOSPITAL_BASED_OUTPATIENT_CLINIC_OR_DEPARTMENT_OTHER): Payer: 59

## 2017-03-11 ENCOUNTER — Encounter (HOSPITAL_COMMUNITY): Payer: Self-pay

## 2017-03-11 ENCOUNTER — Encounter (HOSPITAL_COMMUNITY): Payer: 59

## 2017-03-11 VITALS — BP 150/100 | HR 69 | Temp 98.2°F | Resp 20

## 2017-03-11 DIAGNOSIS — K922 Gastrointestinal hemorrhage, unspecified: Secondary | ICD-10-CM | POA: Diagnosis not present

## 2017-03-11 DIAGNOSIS — E538 Deficiency of other specified B group vitamins: Secondary | ICD-10-CM

## 2017-03-11 DIAGNOSIS — D500 Iron deficiency anemia secondary to blood loss (chronic): Secondary | ICD-10-CM

## 2017-03-11 MED ORDER — SODIUM CHLORIDE 0.9 % IV SOLN
510.0000 mg | Freq: Once | INTRAVENOUS | Status: AC
  Administered 2017-03-11: 510 mg via INTRAVENOUS
  Filled 2017-03-11: qty 17

## 2017-03-11 MED ORDER — CYANOCOBALAMIN 1000 MCG/ML IJ SOLN: 1000.0000 ug | Freq: Once | INTRAMUSCULAR | Status: DC

## 2017-03-11 MED ORDER — VITAMIN B-12 1000 MCG PO TABS: 1000.0000 ug | ORAL_TABLET | Freq: Every day | ORAL | 2 refills | Status: DC

## 2017-03-11 MED ORDER — SODIUM CHLORIDE 0.9 % IV SOLN
Freq: Once | INTRAVENOUS | Status: AC
  Administered 2017-03-11: 15:00:00 via INTRAVENOUS

## 2017-03-11 NOTE — Patient Instructions (Signed)
[facility] at [facility] Discharge Instructions  RECOMMENDATIONS MADE BY THE CONSULTANT AND ANY TEST RESULTS WILL BE SENT TO YOUR REFERRING PHYSICIAN.  Iron infusion today. B12 prescription sent to your pharmacy. Return as scheduled.   Thank you for choosing  Cancer Center at [facility] to provide your oncology and hematology care.  To afford each patient quality time with our provider, please arrive at least 15 minutes before your scheduled appointment time.    If you have a lab appointment with the Cancer Center please come in thru the  Main Entrance and check in at the main information desk  You need to re-schedule your appointment should you arrive 10 or more minutes late.  We strive to give you quality time with our providers, and arriving late affects you and other patients whose appointments are after yours.  Also, if you no show three or more times for appointments you may be dismissed from the clinic at the providers discretion.     Again, thank you for choosing [facility].  Our hope is that these requests will decrease the amount of time that you wait before being seen by our physicians.       _____________________________________________________________  Should you have questions after your visit to [facility], please contact our office at [phone] between the hours of 8:30 a.m. and 4:30 p.m.  Voicemails left after 4:30 p.m. will not be returned until the following business day.  For prescription refill requests, have your pharmacy contact our office.       Resources For Cancer Patients and their Caregivers ? American Cancer Society: Can assist with transportation, wigs, general needs, runs Look Good Feel Better.        607-638-4169 ? Cancer Care: Provides financial assistance, online support groups, medication/co-pay assistance.  1-800-813-HOPE 954-221-1210) ? [facility] Assists [place] cancer patients and their families through emotional , educational and financial support.  (856)414-5519 ? [place] DSS Where to apply for food stamps, Medicaid and utility assistance. 754-204-0132 ? [employer]: Transportation to medical appointments. 6807002251 ? Social Security Administration: May apply for disability if have a Stage IV cancer. [phone] 941-167-2226 ? [employer], Disability and Transit Services: Assists with nutrition, care and transit needs. (930) 424-2880  Cancer Center Support Programs: @ > Cancer Support Group  2nd Tuesday of the month 1pm-2pm, Journey Room  > Creative Journey  3rd Tuesday of the month 1130am-1pm, Journey Room  > Look Good Feel Better  1st Wednesday of the month 10am-12 noon, Journey Room (Call American Cancer Society to register [phone])

## 2017-03-11 NOTE — Progress Notes (Signed)
Pt refuses B12 injection in clinic today due to the fact that he would have to come weekly for injections x 4 weeks.  States, "just give me the pill".  Reports he is also taking oral iron.  Discussed with Dr. Janyth Contes and Stanford Scotland, NP.  Orders rec'd to Rx B12 1000 mcg po daily.  Also instructed per MD and NP that pt may stop oral iron since he is having to come for IV iron replacement. Pt instructed accordingly and notified of Rx sent to pharmacy.   Tolerated infusion w/o adverse reaction.  Alert, in no distress. VSS.  Discharged ambulatory.

## 2017-03-12 LAB — PATHOLOGIST SMEAR REVIEW

## 2017-03-12 LAB — ANTI-PARIETAL ANTIBODY: Parietal Cell Antibody-IgG: 12 Units (ref 0.0–20.0)

## 2017-03-12 LAB — INTRINSIC FACTOR ANTIBODIES: Intrinsic Factor: 1 AU/mL (ref 0.0–1.1)

## 2017-03-12 LAB — HOMOCYSTEINE: HOMOCYSTEINE-NORM: 12.2 umol/L (ref 0.0–15.0)

## 2017-03-15 LAB — METHYLMALONIC ACID, SERUM: METHYLMALONIC ACID, QUANTITATIVE: 194 nmol/L (ref 0–378)

## 2017-03-16 ENCOUNTER — Encounter: Payer: Self-pay | Admitting: *Deleted

## 2017-03-16 NOTE — Progress Notes (Signed)
[facility] Clinical Social Work  Clinical Social Work was referred by nurse for assessment of psychosocial needs due to possible depression, recently quitting his job and possible substance abuse concerns. Clinical Social Worker attempted to contact patient at home to offer support and assess for needs.  CSW introduced self, explained role of CSW and encouraged pt to return call in order to discuss resources to assist.      Troy Salvage, LCSW, OSW-C [facility] Tuesdays   [phone]

## 2017-03-18 ENCOUNTER — Encounter (HOSPITAL_COMMUNITY): Payer: Self-pay | Admitting: Adult Health

## 2017-03-18 ENCOUNTER — Encounter (HOSPITAL_COMMUNITY): Payer: Self-pay

## 2017-03-18 ENCOUNTER — Other Ambulatory Visit (HOSPITAL_COMMUNITY): Payer: Self-pay

## 2017-03-18 ENCOUNTER — Other Ambulatory Visit (HOSPITAL_COMMUNITY): Payer: Self-pay | Admitting: Adult Health

## 2017-03-18 ENCOUNTER — Encounter (HOSPITAL_COMMUNITY): Payer: 59 | Attending: Oncology

## 2017-03-18 VITALS — BP 150/90 | HR 76 | Temp 98.1°F | Resp 18

## 2017-03-18 DIAGNOSIS — D649 Anemia, unspecified: Secondary | ICD-10-CM

## 2017-03-18 DIAGNOSIS — K922 Gastrointestinal hemorrhage, unspecified: Secondary | ICD-10-CM

## 2017-03-18 DIAGNOSIS — E538 Deficiency of other specified B group vitamins: Secondary | ICD-10-CM

## 2017-03-18 DIAGNOSIS — R7989 Other specified abnormal findings of blood chemistry: Secondary | ICD-10-CM | POA: Insufficient documentation

## 2017-03-18 DIAGNOSIS — D500 Iron deficiency anemia secondary to blood loss (chronic): Secondary | ICD-10-CM | POA: Diagnosis not present

## 2017-03-18 MED ORDER — B-12 5000 MCG SL SUBL
5000.0000 ug | SUBLINGUAL_TABLET | Freq: Every day | SUBLINGUAL | 6 refills | Status: DC
Start: 2017-03-18 — End: 2021-12-30

## 2017-03-18 MED ORDER — SODIUM CHLORIDE 0.9 % IV SOLN
510.0000 mg | Freq: Once | INTRAVENOUS | Status: AC
  Administered 2017-03-18: 510 mg via INTRAVENOUS
  Filled 2017-03-18: qty 17

## 2017-03-18 MED ORDER — SODIUM CHLORIDE 0.9 % IV SOLN
Freq: Once | INTRAVENOUS | Status: AC
  Administered 2017-03-18: 15:00:00 via INTRAVENOUS

## 2017-03-18 NOTE — Progress Notes (Signed)
Troy [name] tolerated Feraheme infusion well without complaints or incident. Pt refused the Vit B12 injections that were recommended by PA but he was willing to take the Vit B12 5000 mcg SL daily after speaking with Lubertha BasqueGretchen Dawson NP. VSS upon discharge. Pt discharged self ambulatory in satisfactory condition

## 2017-03-18 NOTE — Progress Notes (Signed)
Pt here today for IV iron infusion. There is confusion about his oral supplementation recommendations for home, in terms of oral iron and oral B12 supplements. He wants to know why he needs to take new dose of vitamin B12 "when all my tests were negative."   Reinforced the following recommendations with patient in person today:   *Since his serum B12 was on the low end of normal, it could be playing a small role in his anemia. Of course, the biggest reason he has anemia is iron deficiency.   *Recommended he stop taking 2000 oral B12 supplement. He should start new prescription of B12 5000 sublingual. He received a paper prescription for this today.   *Recommended he stop taking po iron supplements, as they are likely not providing much benefit at this point.  He understands that we will recheck his lab work before his next visit.   He voiced understanding and was able to repeat the above instructions to me prior to leaving clinic. Encouraged him to call with any other questions.   He prefers to have labs a few days before his next follow-up visit so that we have results available to review with him. This is totally reasonable. I will add on vitamin B12 to these labs.  He voiced appreciation for clarification.     Lubertha BasqueGretchen Dawson, NP [facility] [phone]

## 2017-03-18 NOTE — Patient Instructions (Signed)
[facility] at [facility] Discharge Instructions  RECOMMENDATIONS MADE BY THE CONSULTANT AND ANY TEST RESULTS WILL BE SENT TO YOUR REFERRING PHYSICIAN.  Received Feraheme infusion today.Follow-up as scheduled. Call clinic for any questions or concerns  Thank you for choosing [facility] at [facility] to provide your oncology and hematology care.  To afford each patient quality time with our provider, please arrive at least 15 minutes before your scheduled appointment time.    If you have a lab appointment with the Cancer Center please come in thru the  Main Entrance and check in at the main information desk  You need to re-schedule your appointment should you arrive 10 or more minutes late.  We strive to give you quality time with our providers, and arriving late affects you and other patients whose appointments are after yours.  Also, if you no show three or more times for appointments you may be dismissed from the clinic at the providers discretion.     Again, thank you for choosing [facility].  Our hope is that these requests will decrease the amount of time that you wait before being seen by our physicians.       _____________________________________________________________  Should you have questions after your visit to [facility], please contact our office at [phone] between the hours of 8:30 a.m. and 4:30 p.m.  Voicemails left after 4:30 p.m. will not be returned until the following business day.  For prescription refill requests, have your pharmacy contact our office.       Resources For Cancer Patients and their Caregivers ? American Cancer Society: Can assist with transportation, wigs, general needs, runs Look Good Feel Better.        1-888-227-6333 ? Cancer Care: Provides financial assistance, online support groups, medication/co-pay assistance.  1-800-813-HOPE (4673) ? [facility] Assists [place] cancer patients and their families through emotional , educational and financial support.  336-427-4357 ? [place] DSS Where to apply for food stamps, Medicaid and utility assistance. 336-342-1394 ? RCATS: Transportation to medical appointments. 336-347-2287 ? Social Security Administration: May apply for disability if have a Stage IV cancer. 336-342-7796 1-800-772-1213 ? [place] Aging, Disability and Transit Services: Assists with nutrition, care and transit needs. 336-349-2343  Cancer Center Support Programs: @10RELATIVEDAYS@ > Cancer Support Group  2nd Tuesday of the month 1pm-2pm, Journey Room  > Creative Journey  3rd Tuesday of the month 1130am-1pm, Journey Room  > Look Good Feel Better  1st Wednesday of the month 10am-12 noon, Journey Room (Call American Cancer Society to register [phone])   

## 2017-03-25 ENCOUNTER — Ambulatory Visit (HOSPITAL_COMMUNITY): Payer: 59

## 2017-04-01 ENCOUNTER — Ambulatory Visit (HOSPITAL_COMMUNITY): Payer: 59

## 2017-04-05 ENCOUNTER — Ambulatory Visit: Payer: 59 | Admitting: Gastroenterology

## 2017-04-06 ENCOUNTER — Encounter: Payer: Self-pay | Admitting: *Deleted

## 2017-04-06 NOTE — Progress Notes (Signed)
[facility] Clinical Social Work  Visual merchandiserClinical Social Worker attempted to contact patient again at home to offer support and assess for needs.  CSW introduced self, explained role of CSW and encouraged pt to return call in order to discuss resources to assist.      Doreen SalvageGrier Eleen Litz, LCSW, OSW-C [facility] Tuesdays   [phone]

## 2017-04-06 NOTE — Progress Notes (Signed)
[facility] Clinical Social Work  Visual merchandiserClinical Social Worker [facility]. Pt reports continued depression, high intake of alcohol and difficulty coping with unemployment. Pt willingly quit his job due to stress and now feels he cannot look for additional employment due to his anemia. CSW reviewed possible options for assistance and CSW further explained role of CSW/Pt and Family Support Team, support groups and other resources to assist. CSW explained of role to assist oncology patients and limited experience with his current concerns. CSW reviewed local options for more specified help for his exact need. Pt plans to reach out to those providers for further counseling assistance. Pt encouraged to follow up with CSW as needed and CSW will assess for additional needs at appointment on June 19.      Clinical Social Work interventions: Resource education and referral  Doreen SalvageGrier Jenilee Franey, Erick BlinksLCSW, OSW-C [facility] Tuesdays   [phone]

## 2017-04-30 ENCOUNTER — Encounter (HOSPITAL_COMMUNITY): Payer: 59 | Attending: Oncology

## 2017-04-30 DIAGNOSIS — R7989 Other specified abnormal findings of blood chemistry: Secondary | ICD-10-CM | POA: Diagnosis not present

## 2017-04-30 DIAGNOSIS — D500 Iron deficiency anemia secondary to blood loss (chronic): Secondary | ICD-10-CM | POA: Diagnosis not present

## 2017-04-30 DIAGNOSIS — E538 Deficiency of other specified B group vitamins: Secondary | ICD-10-CM

## 2017-04-30 DIAGNOSIS — D649 Anemia, unspecified: Secondary | ICD-10-CM

## 2017-04-30 LAB — COMPREHENSIVE METABOLIC PANEL
ALBUMIN: 4.3 g/dL (ref 3.5–5.0)
ALT: 29 U/L (ref 17–63)
AST: 24 U/L (ref 15–41)
Alkaline Phosphatase: 65 U/L (ref 38–126)
Anion gap: 10 (ref 5–15)
BUN: 16 mg/dL (ref 6–20)
CHLORIDE: 106 mmol/L (ref 101–111)
CO2: 25 mmol/L (ref 22–32)
CREATININE: 1.24 mg/dL (ref 0.61–1.24)
Calcium: 9.1 mg/dL (ref 8.9–10.3)
GFR calc non Af Amer: >60 mL/min (ref >60)
GLUCOSE: 124 mg/dL — AB (ref 65–99)
Potassium: 4.2 mmol/L (ref 3.5–5.1)
SODIUM: 141 mmol/L (ref 135–145)
Total Bilirubin: 1 mg/dL (ref 0.3–1.2)
Total Protein: 7.8 g/dL (ref 6.5–8.1)

## 2017-04-30 LAB — VITAMIN B12: VITAMIN B 12: 2167 pg/mL — AB (ref 180–914)

## 2017-04-30 LAB — CBC WITH DIFFERENTIAL/PLATELET
BASOS ABS: 0 10*3/uL (ref 0.0–0.1)
BASOS PCT: 1 %
EOS ABS: 0.2 10*3/uL (ref 0.0–0.7)
EOS PCT: 3 %
HCT: 45.3 % (ref 39.0–52.0)
Hemoglobin: 14.8 g/dL (ref 13.0–17.0)
Lymphocytes Relative: 26 %
Lymphs Abs: 1.3 10*3/uL (ref 0.7–4.0)
MCH: 27.2 pg (ref 26.0–34.0)
MCHC: 32.7 g/dL (ref 30.0–36.0)
MCV: 83.1 fL (ref 78.0–100.0)
Monocytes Absolute: 0.2 10*3/uL (ref 0.1–1.0)
Monocytes Relative: 5 %
Neutro Abs: 3.2 10*3/uL (ref 1.7–7.7)
Neutrophils Relative %: 65 %
PLATELETS: 248 10*3/uL (ref 150–400)
RBC: 5.45 MIL/uL (ref 4.22–5.81)
RDW: 23.5 % — ABNORMAL HIGH (ref 11.5–15.5)
WBC: 5 10*3/uL (ref 4.0–10.5)

## 2017-04-30 LAB — IRON AND TIBC
Iron: 108 ug/dL (ref 45–182)
Saturation Ratios: 22 % (ref 17.9–39.5)
TIBC: 496 ug/dL — AB (ref 250–450)
UIBC: 388 ug/dL

## 2017-04-30 LAB — FERRITIN: FERRITIN: 19 ng/mL — AB (ref 24–336)

## 2017-05-04 ENCOUNTER — Encounter (HOSPITAL_COMMUNITY): Payer: 59

## 2017-05-04 ENCOUNTER — Other Ambulatory Visit (HOSPITAL_COMMUNITY): Payer: 59

## 2017-09-06 ENCOUNTER — Ambulatory Visit (HOSPITAL_COMMUNITY)
Admission: RE | Admit: 2017-09-06 | Discharge: 2017-09-06 | Disposition: A | Discharge status: Home / Self Care | Payer: 59 | Source: Ambulatory Visit | Attending: Family Medicine | Admitting: Family Medicine

## 2017-09-06 ENCOUNTER — Other Ambulatory Visit (HOSPITAL_COMMUNITY): Payer: Self-pay | Admitting: Family Medicine

## 2017-09-06 DIAGNOSIS — R52 Pain, unspecified: Secondary | ICD-10-CM

## 2017-09-06 DIAGNOSIS — M25512 Pain in left shoulder: Secondary | ICD-10-CM | POA: Diagnosis not present

## 2019-07-27 ENCOUNTER — Encounter: Payer: Self-pay | Admitting: Gastroenterology

## 2019-07-28 ENCOUNTER — Inpatient Hospital Stay (HOSPITAL_COMMUNITY): Payer: 59

## 2019-07-28 ENCOUNTER — Other Ambulatory Visit: Payer: Self-pay

## 2019-07-28 ENCOUNTER — Inpatient Hospital Stay (HOSPITAL_COMMUNITY): Payer: 59 | Attending: Nurse Practitioner | Admitting: Nurse Practitioner

## 2019-07-28 VITALS — BP 134/73 | HR 66 | Resp 16

## 2019-07-28 DIAGNOSIS — I10 Essential (primary) hypertension: Secondary | ICD-10-CM | POA: Diagnosis not present

## 2019-07-28 DIAGNOSIS — K219 Gastro-esophageal reflux disease without esophagitis: Secondary | ICD-10-CM | POA: Diagnosis not present

## 2019-07-28 DIAGNOSIS — D500 Iron deficiency anemia secondary to blood loss (chronic): Secondary | ICD-10-CM | POA: Diagnosis not present

## 2019-07-28 DIAGNOSIS — D509 Iron deficiency anemia, unspecified: Secondary | ICD-10-CM | POA: Diagnosis not present

## 2019-07-28 DIAGNOSIS — Z79899 Other long term (current) drug therapy: Secondary | ICD-10-CM | POA: Insufficient documentation

## 2019-07-28 DIAGNOSIS — F419 Anxiety disorder, unspecified: Secondary | ICD-10-CM | POA: Diagnosis not present

## 2019-07-28 MED ORDER — SODIUM CHLORIDE 0.9 % IV SOLN
200.0000 mg | Freq: Once | INTRAVENOUS | Status: AC
  Administered 2019-07-28: 12:00:00 200 mg via INTRAVENOUS
  Filled 2019-07-28: qty 10

## 2019-07-28 MED ORDER — SODIUM CHLORIDE 0.9 % IV SOLN
INTRAVENOUS | Status: DC
  Administered 2019-07-28: 11:00:00 via INTRAVENOUS

## 2019-07-28 NOTE — Progress Notes (Signed)
[facility] [address] [place], [place] [address]   CLINIC:  Medical Oncology/Hematology  PCP:  Gareth MorganKnowlton, Steve, MD [address] [place] [phone]   REASON FOR VISIT: Follow-up for iron deficiency anemia  CURRENT THERAPY: Intermittent iron infusions   INTERVAL HISTORY:  [name] 58 y.o. male returns for routine follow-up for iron deficiency anemia.  Patient reports he has been feeling more fatigued and weak lately.  He does have intermittent chest pains with exertion.  He become short of breath with exertion.  He denies any bright red bleeding per rectum or melena. Denies any nausea, vomiting, or diarrhea. Denies any new pains. Had not noticed any recent bleeding such as epistaxis, hematuria or hematochezia. Denies pre-syncopal episodes, or palpitations. Denies any numbness or tingling in hands or feet. Denies any recent fevers, infections, or recent hospitalizations. Patient reports appetite at 100% and energy level at 50%.  He is eating well maintaining his weight at this time.    REVIEW OF SYSTEMS:  Review of Systems  Constitutional: Positive for fatigue.  Respiratory: Positive for chest tightness and shortness of breath.   All other systems reviewed and are negative.    PAST MEDICAL/SURGICAL HISTORY:  Past Medical History:  Diagnosis Date  . Alcohol dependence (HCC)   . Anxiety   . Chest pain   . GERD (gastroesophageal reflux disease)   . Hypertension   . Insomnia   . Iron deficiency anemia 11/25/2016  . Plantar fascial fibromatosis   . Psoriasis    Past Surgical History:  Procedure Laterality Date  . BIOPSY  12/01/2016   Procedure: BIOPSY;  Surgeon: West BaliSandi L Fields, MD;  [facility];  Service: Endoscopy;;  gastric duodenal hiatal hernia pouch  . COLONOSCOPY WITH PROPOFOL N/A 12/01/2016   Procedure: COLONOSCOPY WITH PROPOFOL;  Surgeon: West BaliSandi L Fields, MD;  [facility];  Service: Endoscopy;  Laterality:  N/A;  1:45 pm  . ESOPHAGOGASTRODUODENOSCOPY  2004   Dr. 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 ESOPHAGOGASTRODUODENOSCOPY (EGD) WITH PROPOFOL N/A 12/01/2016   Procedure: ESOPHAGOGASTRODUODENOSCOPY (EGD) WITH PROPOFOL;  Surgeon: West BaliSandi L Fields, MD;  [facility];  Service: Endoscopy;  Laterality: N/A;  . GIVENS CAPSULE STUDY N/A 12/23/2016   Procedure: GIVENS CAPSULE STUDY;  Surgeon: West BaliSandi L Fields, MD;  [facility];  Service: Endoscopy;  Laterality: N/A;  arrive at 7:00am for 7:30am appt time  . HEMORRHOID SURGERY     residual skin tags  . right ankle    . UMBILICAL HERNIA REPAIR N/A 06/07/2014   Procedure: HERNIA REPAIR UMBILICAL ADULT;  Surgeon: Marlane HatcherWilliam S Bradford, MD;  Location: [facility];  Service: General;  Laterality: N/A;     SOCIAL HISTORY:  Social History   Socioeconomic History  . Marital status: Married    Spouse name: Not on file  . Number of children: Not on file  . Years of education: Not on file  . Highest education level: Not on file  Occupational History  . Not on file  Social Needs  . Financial resource strain: Not on file  . Food insecurity    Worry: Not on file    Inability: Not on file  . Transportation needs    Medical: Not on file    Non-medical: Not on file  Tobacco Use  . Smoking status: Never Smoker  . Smokeless tobacco: Never Used  Substance and Sexual Activity  . Alcohol use:  Yes    Comment: 6-12 beers at a time sometimes daily.   . Drug use: No  . Sexual activity: Yes  Lifestyle  . Physical activity    Days per week: Not on file    Minutes per session: Not on file  . Stress: Not on file  Relationships  . Social Musician on phone: Not on file    Gets together: Not on file    Attends religious service: Not on file    Active member of club or organization: Not on file    Attends meetings of clubs or organizations: Not on file    Relationship status: Not on file  . Intimate partner  violence    Fear of current or ex partner: Not on file    Emotionally abused: Not on file    Physically abused: Not on file    Forced sexual activity: Not on file  Other Topics Concern  . Not on file  Social History Narrative  . Not on file    FAMILY HISTORY:  Family History  Problem Relation Age of Onset  . Heart disease Mother        stent placement  . Heart attack Father        died from MR age 34  . Heart attack Maternal Grandmother   . Colon cancer Paternal Grandfather     CURRENT MEDICATIONS:  Outpatient Encounter Medications as of 07/28/2019  Medication Sig  . ferrous sulfate 325 (65 FE) MG EC tablet Take 325 mg by mouth 2 (two) times daily.  Marland Kitchen lisinopril (PRINIVIL,ZESTRIL) 20 MG tablet Take 10 mg by mouth daily.   Marland Kitchen omeprazole (PRILOSEC) 20 MG capsule 1 PO 30 mins prior to breakfast  . Polysacch Fe Cmp-Fe Heme Poly (BIFERA) 28 MG TABS 1 po bid  . Cyanocobalamin (B-12) 5000 MCG SUBL Place 5,000 mcg under the tongue daily. (Patient not taking: Reported on 07/28/2019)   No facility-administered encounter medications on file as of 07/28/2019.     ALLERGIES:  No Known Allergies   PHYSICAL EXAM:  ECOG Performance status: 1  Vitals:   07/28/19 1022  BP: 136/76  Pulse: 82  Resp: 18  Temp: 98.2 F (36.8 C)  SpO2: 100%   Filed Weights   07/28/19 1022  Weight: 214 lb 8 oz (97.3 kg)    Physical Exam Constitutional:      Appearance: Normal appearance. He is normal weight.  Cardiovascular:     Rate and Rhythm: Normal rate and regular rhythm.     Heart sounds: Normal heart sounds.  Pulmonary:     Effort: Pulmonary effort is normal.     Breath sounds: Normal breath sounds.  Abdominal:     General: Bowel sounds are normal.     Palpations: Abdomen is soft.  Musculoskeletal: Normal range of motion.  Skin:    General: Skin is warm and dry.  Neurological:     Mental Status: He is alert and oriented to person, place, and time. Mental status is at baseline.   Psychiatric:        Mood and Affect: Mood normal.        Behavior: Behavior normal.        Thought Content: Thought content normal.        Judgment: Judgment normal.      LABORATORY DATA:  I have reviewed the labs as listed.  CBC    Component Value Date/Time   WBC 5.0 04/30/2017 1125  RBC 5.45 04/30/2017 1125   HGB 14.8 04/30/2017 1125   HCT 45.3 04/30/2017 1125   PLT 248 04/30/2017 1125   MCV 83.1 04/30/2017 1125   MCH 27.2 04/30/2017 1125   MCHC 32.7 04/30/2017 1125   RDW 23.5 (H) 04/30/2017 1125   LYMPHSABS 1.3 04/30/2017 1125   MONOABS 0.2 04/30/2017 1125   EOSABS 0.2 04/30/2017 1125   BASOSABS 0.0 04/30/2017 1125   CMP Latest Ref Rng & Units 04/30/2017 03/09/2017 11/27/2016  Glucose 65 - 99 mg/dL 124(H) 130(H) 145(H)  BUN 6 - 20 mg/dL 16 18 13   Creatinine 0.61 - 1.24 mg/dL 1.24 0.96 0.96  Sodium 135 - 145 mmol/L 141 139 139  Potassium 3.5 - 5.1 mmol/L 4.2 4.2 3.8  Chloride 101 - 111 mmol/L 106 104 106  CO2 22 - 32 mmol/L 25 27 25   Calcium 8.9 - 10.3 mg/dL 9.1 9.3 8.7(L)  Total Protein 6.5 - 8.1 g/dL 7.8 - -  Total Bilirubin 0.3 - 1.2 mg/dL 1.0 - -  Alkaline Phos 38 - 126 U/L 65 - -  AST 15 - 41 U/L 24 - -  ALT 17 - 63 U/L 29 - -     I personally performed a face-to-face visit.  All questions were answered to patient's stated satisfaction. Encouraged patient to call with any new concerns or questions before his next visit to the cancer center and we can certain see him sooner, if needed.     ASSESSMENT & PLAN:   Iron deficiency anemia 1.  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 - EGD/colonoscopy done on 12/01/2016 showed AVMs and Cameron's erosions. -Capsule study done on 12/23/2016 by Dr. Oneida Alar. -Patient had a CT abdomen and pelvis on 12/2016 which was negative for any hematologic issues. -Patient had labs on 02/02/2017 which showed reticulocyte count, haptoglobin, EPO level, LDH were  all within normal limits. -His last dose of IV iron was on 03/18/2017. - He is currently taking 2 iron tablets daily however there is no improvement seen in his iron levels. -Labs done on 07/25/2019 showed his hemoglobin 7.3 ferritin 2 and percent saturation 1. -We will set him up with 4 doses of IV Venofer 200 mg. -We will see him back in 3 months with repeat labs      Orders placed this encounter:  Orders Placed This Encounter  Procedures  . Lactate dehydrogenase  . CBC with Differential/Platelet  . Comprehensive metabolic panel  . Ferritin  . Iron and TIBC  . Vitamin B12  . VITAMIN D 25 Hydroxy (Vit-D Deficiency, Fractures)  . Folate      Francene Finders, FNP-C [facility] [phone]

## 2019-07-28 NOTE — Patient Instructions (Signed)
[facility] Discharge Instructions  Follow up in 3 months with lab s   Thank you for choosing [facility] at [facility] to provide your oncology and hematology care.  To afford each patient quality time with our provider, please arrive at least 15 minutes before your scheduled appointment time.   If you have a lab appointment with the Cancer Center please come in thru the Main Entrance and check in at the main information desk.  You need to re-schedule your appointment should you arrive 10 or more minutes late.  We strive to give you quality time with our providers, and arriving late affects you and other patients whose appointments are after yours.  Also, if you no show three or more times for appointments you may be dismissed from the clinic at the providers discretion.     Again, thank you for choosing [facility].  Our hope is that these requests will decrease the amount of time that you wait before being seen by our physicians.       _____________________________________________________________  Should you have questions after your visit to [facility], please contact our office at [phone] between the hours of 8:00 a.m. and 4:30 p.m.  Voicemails left after 4:00 p.m. will not be returned until the following business day.  For prescription refill requests, have your pharmacy contact our office and allow 72 hours.    Due to Covid, you will need to wear a mask upon entering the hospital. If you do not have a mask, a mask will be given to you at the Main Entrance upon arrival. For doctor visits, patients may have 1 support person with them. For treatment visits, patients can not have anyone with them due to social distancing guidelines and our immunocompromised population.      

## 2019-07-28 NOTE — Assessment & Plan Note (Signed)
1.  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 - EGD/colonoscopy done on 12/01/2016 showed AVMs and Cameron's erosions. -Capsule study done on 12/23/2016 by Dr. Oneida Alar. -Patient had a CT abdomen and pelvis on 12/2016 which was negative for any hematologic issues. -Patient had labs on 02/02/2017 which showed reticulocyte count, haptoglobin, EPO level, LDH were all within normal limits. -His last dose of IV iron was on 03/18/2017. - He is currently taking 2 iron tablets daily however there is no improvement seen in his iron levels. -Labs done on 07/25/2019 showed his hemoglobin 7.3 ferritin 2 and percent saturation 1. -We will set him up with 4 doses of IV Venofer 200 mg. -We will see him back in 3 months with repeat labs

## 2019-07-31 ENCOUNTER — Ambulatory Visit (HOSPITAL_COMMUNITY): Payer: 59 | Admitting: Nurse Practitioner

## 2019-08-04 ENCOUNTER — Ambulatory Visit (HOSPITAL_COMMUNITY): Payer: 59

## 2019-08-07 ENCOUNTER — Inpatient Hospital Stay (HOSPITAL_COMMUNITY): Payer: 59

## 2019-08-07 ENCOUNTER — Other Ambulatory Visit (HOSPITAL_COMMUNITY): Payer: Self-pay | Admitting: Nurse Practitioner

## 2019-08-07 ENCOUNTER — Other Ambulatory Visit: Payer: Self-pay

## 2019-08-07 VITALS — BP 140/90 | HR 70 | Temp 98.3°F | Resp 18

## 2019-08-07 DIAGNOSIS — D509 Iron deficiency anemia, unspecified: Secondary | ICD-10-CM | POA: Diagnosis not present

## 2019-08-07 DIAGNOSIS — D500 Iron deficiency anemia secondary to blood loss (chronic): Secondary | ICD-10-CM

## 2019-08-07 MED ORDER — SODIUM CHLORIDE 0.9 % IV SOLN
200.0000 mg | Freq: Once | INTRAVENOUS | Status: AC
  Administered 2019-08-07: 200 mg via INTRAVENOUS
  Filled 2019-08-07: qty 10

## 2019-08-07 MED ORDER — CYANOCOBALAMIN 1000 MCG/ML IJ SOLN
1000.0000 ug | Freq: Once | INTRAMUSCULAR | Status: AC
  Administered 2019-08-07: 1000 ug via INTRAMUSCULAR
  Filled 2019-08-07: qty 1

## 2019-08-07 MED ORDER — SODIUM CHLORIDE 0.9 % IV SOLN
Freq: Once | INTRAVENOUS | Status: AC
  Administered 2019-08-07: 14:00:00 via INTRAVENOUS

## 2019-08-07 NOTE — Progress Notes (Signed)
Venofer given today per MD orders. Tolerated infusion without adverse affects. Vital signs stable. No complaints at this time. Discharged from clinic ambulatory. F/U with [facility] as scheduled.  

## 2019-08-07 NOTE — Patient Instructions (Signed)
[facility]  Discharge Instructions:   _______________________________________________________________  Thank you for choosing [facility] at [facility] to provide your oncology and hematology care.  To afford each patient quality time with our providers, please arrive at least 15 minutes before your scheduled appointment.  You need to re-schedule your appointment if you arrive 10 or more minutes late.  We strive to give you quality time with our providers, and arriving late affects you and other patients whose appointments are after yours.  Also, if you no show three or more times for appointments you may be dismissed from the clinic.  Again, thank you for choosing [facility]. Our hope is that these requests will allow you access to exceptional care and in a timely manner. _______________________________________________________________  If you have questions after your visit, please contact our office at [phone] between the hours of 8:30 a.m. and 5:00 p.m. Voicemails left after 4:30 p.m. will not be returned until the following business day. _______________________________________________________________  For prescription refill requests, have your pharmacy contact our office. _______________________________________________________________  Recommendations made by the consultant and any test results will be sent to your referring physician. _______________________________________________________________ 

## 2019-08-11 ENCOUNTER — Ambulatory Visit (HOSPITAL_COMMUNITY): Payer: 59

## 2019-08-14 ENCOUNTER — Ambulatory Visit (HOSPITAL_COMMUNITY): Payer: 59

## 2019-08-16 ENCOUNTER — Ambulatory Visit (HOSPITAL_COMMUNITY): Payer: 59

## 2019-08-17 ENCOUNTER — Encounter (HOSPITAL_COMMUNITY): Payer: Self-pay

## 2019-08-17 ENCOUNTER — Inpatient Hospital Stay (HOSPITAL_COMMUNITY): Payer: 59 | Attending: Nurse Practitioner

## 2019-08-17 ENCOUNTER — Other Ambulatory Visit: Payer: Self-pay

## 2019-08-17 ENCOUNTER — Ambulatory Visit: Payer: 59 | Admitting: Nurse Practitioner

## 2019-08-17 VITALS — BP 142/73 | HR 63 | Temp 98.0°F | Resp 18

## 2019-08-17 DIAGNOSIS — D500 Iron deficiency anemia secondary to blood loss (chronic): Secondary | ICD-10-CM

## 2019-08-17 DIAGNOSIS — D509 Iron deficiency anemia, unspecified: Secondary | ICD-10-CM | POA: Diagnosis not present

## 2019-08-17 MED ORDER — SODIUM CHLORIDE 0.9 % IV SOLN
200.0000 mg | Freq: Once | INTRAVENOUS | Status: AC
  Administered 2019-08-17: 200 mg via INTRAVENOUS
  Filled 2019-08-17: qty 10

## 2019-08-17 MED ORDER — SODIUM CHLORIDE 0.9 % IV SOLN
Freq: Once | INTRAVENOUS | Status: AC
  Administered 2019-08-17: 14:00:00 via INTRAVENOUS

## 2019-08-17 NOTE — Patient Instructions (Signed)
[facility] at [facility] Discharge Instructions  Received Venofer infusion today. Follow-up as scheduled. Call clinic for any questions or concerns   Thank you for choosing [facility] at [facility] to provide your oncology and hematology care.  To afford each patient quality time with our provider, please arrive at least 15 minutes before your scheduled appointment time.   If you have a lab appointment with the Cancer Center please come in thru the Main Entrance and check in at the main information desk.  You need to re-schedule your appointment should you arrive 10 or more minutes late.  We strive to give you quality time with our providers, and arriving late affects you and other patients whose appointments are after yours.  Also, if you no show three or more times for appointments you may be dismissed from the clinic at the providers discretion.     Again, thank you for choosing [facility].  Our hope is that these requests will decrease the amount of time that you wait before being seen by our physicians.       _____________________________________________________________  Should you have questions after your visit to [facility], please contact our office at [phone] between the hours of 8:00 a.m. and 4:30 p.m.  Voicemails left after 4:00 p.m. will not be returned until the following business day.  For prescription refill requests, have your pharmacy contact our office and allow 72 hours.    Due to Covid, you will need to wear a mask upon entering the hospital. If you do not have a mask, a mask will be given to you at the Main Entrance upon arrival. For doctor visits, patients may have 1 support person with them. For treatment visits, patients can not have anyone with them due to social distancing guidelines and our immunocompromised population.     

## 2019-08-17 NOTE — Progress Notes (Signed)
Troy [name] tolerated Venofer infusion well without complaints or incident. VSS upon discharge. Pt discharged self ambulatory in satisfactory condition

## 2019-08-18 ENCOUNTER — Ambulatory Visit (HOSPITAL_COMMUNITY): Payer: 59

## 2019-08-21 ENCOUNTER — Ambulatory Visit (HOSPITAL_COMMUNITY): Payer: 59

## 2019-08-24 ENCOUNTER — Ambulatory Visit (HOSPITAL_COMMUNITY): Payer: 59

## 2019-08-25 ENCOUNTER — Other Ambulatory Visit: Payer: Self-pay

## 2019-08-25 ENCOUNTER — Inpatient Hospital Stay (HOSPITAL_COMMUNITY): Payer: 59

## 2019-08-25 ENCOUNTER — Encounter (HOSPITAL_COMMUNITY): Payer: Self-pay

## 2019-08-25 VITALS — BP 134/70 | HR 72 | Temp 97.8°F | Resp 18

## 2019-08-25 DIAGNOSIS — D500 Iron deficiency anemia secondary to blood loss (chronic): Secondary | ICD-10-CM

## 2019-08-25 DIAGNOSIS — D509 Iron deficiency anemia, unspecified: Secondary | ICD-10-CM | POA: Diagnosis not present

## 2019-08-25 MED ORDER — SODIUM CHLORIDE 0.9 % IV SOLN
Freq: Once | INTRAVENOUS | Status: AC
  Administered 2019-08-25: 10:00:00 via INTRAVENOUS

## 2019-08-25 MED ORDER — SODIUM CHLORIDE 0.9 % IV SOLN
200.0000 mg | Freq: Once | INTRAVENOUS | Status: AC
  Administered 2019-08-25: 11:00:00 200 mg via INTRAVENOUS
  Filled 2019-08-25: qty 10

## 2019-08-25 NOTE — Patient Instructions (Signed)
[facility] at [facility]  Discharge Instructions:   _______________________________________________________________  Thank you for choosing [facility] at [facility] to provide your oncology and hematology care.  To afford each patient quality time with our providers, please arrive at least 15 minutes before your scheduled appointment.  You need to re-schedule your appointment if you arrive 10 or more minutes late.  We strive to give you quality time with our providers, and arriving late affects you and other patients whose appointments are after yours.  Also, if you no show three or more times for appointments you may be dismissed from the clinic.  Again, thank you for choosing [facility] at [facility]. Our hope is that these requests will allow you access to exceptional care and in a timely manner. _______________________________________________________________  If you have questions after your visit, please contact our office at [phone] between the hours of 8:30 a.m. and 5:00 p.m. Voicemails left after 4:30 p.m. will not be returned until the following business day. _______________________________________________________________  For prescription refill requests, have your pharmacy contact our office. _______________________________________________________________  Recommendations made by the consultant and any test results will be sent to your referring physician. _______________________________________________________________ 

## 2019-08-25 NOTE — Progress Notes (Signed)
Venofer given per orders. 15 min infusion with a 30 min wait time. Patient tolerated it well without problems. Vitals stable and discharged home from clinic ambulatory. Follow up as scheduled.

## 2019-09-29 ENCOUNTER — Other Ambulatory Visit: Payer: Self-pay | Admitting: *Deleted

## 2019-09-29 DIAGNOSIS — Z20822 Contact with and (suspected) exposure to covid-19: Secondary | ICD-10-CM

## 2019-10-02 LAB — NOVEL CORONAVIRUS, NAA: SARS-CoV-2, NAA: NOT DETECTED

## 2019-10-09 ENCOUNTER — Other Ambulatory Visit: Payer: Self-pay

## 2019-10-09 ENCOUNTER — Inpatient Hospital Stay (HOSPITAL_COMMUNITY): Payer: 59 | Attending: Hematology

## 2019-10-09 DIAGNOSIS — E538 Deficiency of other specified B group vitamins: Secondary | ICD-10-CM | POA: Insufficient documentation

## 2019-10-09 DIAGNOSIS — E559 Vitamin D deficiency, unspecified: Secondary | ICD-10-CM | POA: Diagnosis not present

## 2019-10-09 DIAGNOSIS — D508 Other iron deficiency anemias: Secondary | ICD-10-CM | POA: Diagnosis not present

## 2019-10-09 DIAGNOSIS — Z79899 Other long term (current) drug therapy: Secondary | ICD-10-CM | POA: Diagnosis not present

## 2019-10-09 DIAGNOSIS — D500 Iron deficiency anemia secondary to blood loss (chronic): Secondary | ICD-10-CM

## 2019-10-09 LAB — CBC WITH DIFFERENTIAL/PLATELET
Abs Immature Granulocytes: 0.01 10*3/uL (ref 0.00–0.07)
Basophils Absolute: 0 10*3/uL (ref 0.0–0.1)
Basophils Relative: 1 %
Eosinophils Absolute: 0.2 10*3/uL (ref 0.0–0.5)
Eosinophils Relative: 5 %
HCT: 39 % (ref 39.0–52.0)
Hemoglobin: 10.8 g/dL — ABNORMAL LOW (ref 13.0–17.0)
Immature Granulocytes: 0 %
Lymphocytes Relative: 34 %
Lymphs Abs: 1.2 10*3/uL (ref 0.7–4.0)
MCH: 20.4 pg — ABNORMAL LOW (ref 26.0–34.0)
MCHC: 27.7 g/dL — ABNORMAL LOW (ref 30.0–36.0)
MCV: 73.6 fL — ABNORMAL LOW (ref 80.0–100.0)
Monocytes Absolute: 0.3 10*3/uL (ref 0.1–1.0)
Monocytes Relative: 8 %
Neutro Abs: 1.9 10*3/uL (ref 1.7–7.7)
Neutrophils Relative %: 52 %
Platelets: 311 10*3/uL (ref 150–400)
RBC: 5.3 MIL/uL (ref 4.22–5.81)
RDW: 19.7 % — ABNORMAL HIGH (ref 11.5–15.5)
WBC: 3.6 10*3/uL — ABNORMAL LOW (ref 4.0–10.5)
nRBC: 0 % (ref 0.0–0.2)

## 2019-10-09 LAB — VITAMIN B12: Vitamin B-12: 288 pg/mL (ref 180–914)

## 2019-10-09 LAB — COMPREHENSIVE METABOLIC PANEL
ALT: 21 U/L (ref 0–44)
AST: 18 U/L (ref 15–41)
Albumin: 4.1 g/dL (ref 3.5–5.0)
Alkaline Phosphatase: 59 U/L (ref 38–126)
Anion gap: 10 (ref 5–15)
BUN: 18 mg/dL (ref 6–20)
CO2: 28 mmol/L (ref 22–32)
Calcium: 9.2 mg/dL (ref 8.9–10.3)
Chloride: 104 mmol/L (ref 98–111)
Creatinine, Ser: 0.98 mg/dL (ref 0.61–1.24)
GFR calc Af Amer: >60 mL/min (ref >60)
GFR calc non Af Amer: >60 mL/min (ref >60)
Glucose, Bld: 103 mg/dL — ABNORMAL HIGH (ref 70–99)
Potassium: 4.2 mmol/L (ref 3.5–5.1)
Sodium: 142 mmol/L (ref 135–145)
Total Bilirubin: 0.7 mg/dL (ref 0.3–1.2)
Total Protein: 7.8 g/dL (ref 6.5–8.1)

## 2019-10-09 LAB — VITAMIN D 25 HYDROXY (VIT D DEFICIENCY, FRACTURES): Vit D, 25-Hydroxy: 18.34 ng/mL — ABNORMAL LOW (ref 30–100)

## 2019-10-09 LAB — LACTATE DEHYDROGENASE: LDH: 107 U/L (ref 98–192)

## 2019-10-09 LAB — FOLATE: Folate: 19.7 ng/mL (ref >5.9)

## 2019-10-09 LAB — IRON AND TIBC
Iron: 22 ug/dL — ABNORMAL LOW (ref 45–182)
Saturation Ratios: 4 % — ABNORMAL LOW (ref 17.9–39.5)
TIBC: 602 ug/dL — ABNORMAL HIGH (ref 250–450)
UIBC: 580 ug/dL

## 2019-10-09 LAB — FERRITIN: Ferritin: 5 ng/mL — ABNORMAL LOW (ref 24–336)

## 2019-10-16 ENCOUNTER — Encounter (HOSPITAL_COMMUNITY): Payer: Self-pay | Admitting: Hematology

## 2019-10-16 ENCOUNTER — Telehealth (HOSPITAL_BASED_OUTPATIENT_CLINIC_OR_DEPARTMENT_OTHER): Payer: 59 | Admitting: Hematology

## 2019-10-16 DIAGNOSIS — E559 Vitamin D deficiency, unspecified: Secondary | ICD-10-CM | POA: Diagnosis not present

## 2019-10-16 DIAGNOSIS — E538 Deficiency of other specified B group vitamins: Secondary | ICD-10-CM | POA: Diagnosis not present

## 2019-10-16 DIAGNOSIS — D500 Iron deficiency anemia secondary to blood loss (chronic): Secondary | ICD-10-CM

## 2019-10-16 DIAGNOSIS — D508 Other iron deficiency anemias: Secondary | ICD-10-CM

## 2019-10-16 DIAGNOSIS — Z79899 Other long term (current) drug therapy: Secondary | ICD-10-CM | POA: Diagnosis not present

## 2019-10-16 NOTE — Progress Notes (Signed)
Virtual Visit via Video Note  I connected with Troy [name] on 10/16/19 at  9:45 AM EST by a video enabled telemedicine application and verified that I am speaking with the correct person using two identifiers.   I discussed the limitations of evaluation and management by telemedicine and the availability of in person appointments. The patient expressed understanding and agreed to proceed.  History of Present Illness: He is being followed in the clinic for iron deficiency anemia.  He has occasional hemorrhoidal bleeding.  Last Venofer was on 08/24/2019.  He reportedly stopped taking vitamin B12 tablet at last visit in September.   Observations/Objective: He complains of chest pain on exertion.  He also has some shortness of breath on exertion.  He noticed occasional blood after bowel movements when he is constipated.  Appetite and energy levels are 50%.  He also complained of leg cramps and achiness all over.  Denies any nausea, vomiting, diarrhea or constipation.  No fevers, night sweats or weight loss.  He reports that he has been under a lot of stress since his mother died and trying to sell her estate.  He reportedly quit his job also.  Assessment and Plan:  1.  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 -EGD and colonoscopy on 12/01/2016 showed AVMs and Cameron's lesions. -Capsule study on 12/23/2016 by Dr. Oneida Alar. -Last Venofer was on 08/25/2019. -We reviewed his labs.  Ferritin is down to 5. -Hemoglobin is down to 10.8 with MCV of 73.6. -I have recommended Venofer x5 infusions, 2 to 3 days apart.  2.  Vitamin B12 deficiency: -Vitamin B12 is borderline at 288.  He used to take B12 tablets before. -I have told him to restart taking vitamin B12 1 mg daily.  3.  Vitamin D deficiency: -Vitamin D level is 18.34. -I have advised him to start taking vitamin D 3000 international units daily.  I plan to recheck it in 6  weeks.   Follow Up Instructions: Schedule for Venofer x5 infusions. -RTC 6 weeks with labs 1 to 2 days prior.   I discussed the assessment and treatment plan with the patient. The patient was provided an opportunity to ask questions and all were answered. The patient agreed with the plan and demonstrated an understanding of the instructions.   The patient was advised to call back or seek an in-person evaluation if the symptoms worsen or if the condition fails to improve as anticipated.  I provided 12 minutes of non-face-to-face time during this encounter.   Derek Jack, MD

## 2019-10-20 ENCOUNTER — Encounter (HOSPITAL_COMMUNITY): Payer: Self-pay

## 2019-10-20 ENCOUNTER — Other Ambulatory Visit: Payer: Self-pay

## 2019-10-20 ENCOUNTER — Inpatient Hospital Stay (HOSPITAL_COMMUNITY): Payer: 59 | Attending: Nurse Practitioner

## 2019-10-20 VITALS — BP 119/62 | HR 65 | Temp 97.5°F | Resp 16

## 2019-10-20 DIAGNOSIS — D509 Iron deficiency anemia, unspecified: Secondary | ICD-10-CM | POA: Insufficient documentation

## 2019-10-20 DIAGNOSIS — D500 Iron deficiency anemia secondary to blood loss (chronic): Secondary | ICD-10-CM

## 2019-10-20 MED ORDER — SODIUM CHLORIDE 0.9 % IV SOLN
Freq: Once | INTRAVENOUS | Status: AC
  Administered 2019-10-20: 09:00:00 via INTRAVENOUS

## 2019-10-20 MED ORDER — SODIUM CHLORIDE 0.9 % IV SOLN
200.0000 mg | INTRAVENOUS | Status: DC
  Administered 2019-10-20: 200 mg via INTRAVENOUS
  Filled 2019-10-20: qty 10

## 2019-10-20 NOTE — Patient Instructions (Signed)
[facility]  Discharge Instructions:   _______________________________________________________________  Thank you for choosing [facility] at [facility] to provide your oncology and hematology care.  To afford each patient quality time with our providers, please arrive at least 15 minutes before your scheduled appointment.  You need to re-schedule your appointment if you arrive 10 or more minutes late.  We strive to give you quality time with our providers, and arriving late affects you and other patients whose appointments are after yours.  Also, if you no show three or more times for appointments you may be dismissed from the clinic.  Again, thank you for choosing [facility] at  [facility]. Our hope is that these requests will allow you access to exceptional care and in a timely manner. _______________________________________________________________  If you have questions after your visit, please contact our office at [phone] between the hours of 8:30 a.m. and 5:00 p.m. Voicemails left after 4:30 p.m. will not be returned until the following business day. _______________________________________________________________  For prescription refill requests, have your pharmacy contact our office. _______________________________________________________________  Recommendations made by the consultant and any test results will be sent to your referring physician. _______________________________________________________________ 

## 2019-10-20 NOTE — Progress Notes (Signed)
Iron given per orders. Patient tolerated it well without problems. Vitals stable and discharged home from clinic ambulatory. Follow up as scheduled.  

## 2019-10-25 ENCOUNTER — Other Ambulatory Visit: Payer: Self-pay

## 2019-10-26 ENCOUNTER — Ambulatory Visit (HOSPITAL_COMMUNITY): Payer: 59

## 2019-10-26 ENCOUNTER — Encounter (HOSPITAL_COMMUNITY): Payer: Self-pay

## 2019-10-26 ENCOUNTER — Inpatient Hospital Stay (HOSPITAL_COMMUNITY): Payer: 59

## 2019-10-26 VITALS — BP 147/86 | HR 88 | Temp 98.2°F | Resp 16

## 2019-10-26 DIAGNOSIS — D500 Iron deficiency anemia secondary to blood loss (chronic): Secondary | ICD-10-CM

## 2019-10-26 DIAGNOSIS — D509 Iron deficiency anemia, unspecified: Secondary | ICD-10-CM | POA: Diagnosis not present

## 2019-10-26 MED ORDER — SODIUM CHLORIDE 0.9 % IV SOLN
200.0000 mg | INTRAVENOUS | Status: DC
  Administered 2019-10-26: 200 mg via INTRAVENOUS
  Filled 2019-10-26: qty 10

## 2019-10-26 MED ORDER — SODIUM CHLORIDE 0.9 % IV SOLN
Freq: Once | INTRAVENOUS | Status: AC
  Administered 2019-10-26: 08:00:00 via INTRAVENOUS

## 2019-10-26 NOTE — Patient Instructions (Signed)
[facility]  Discharge Instructions:   _______________________________________________________________  Thank you for choosing [facility] at [facility] to provide your oncology and hematology care.  To afford each patient quality time with our providers, please arrive at least 15 minutes before your scheduled appointment.  You need to re-schedule your appointment if you arrive 10 or more minutes late.  We strive to give you quality time with our providers, and arriving late affects you and other patients whose appointments are after yours.  Also, if you no show three or more times for appointments you may be dismissed from the clinic.  Again, thank you for choosing [facility] at [facility]. Our hope is that these requests will allow you access to exceptional care and in a timely manner. _______________________________________________________________  If you have questions after your visit, please contact our office at [phone] between the hours of 8:30 a.m. and 5:00 p.m. Voicemails left after 4:30 p.m. will not be returned until the following business day. _______________________________________________________________  For prescription refill requests, have your pharmacy contact our office. _______________________________________________________________  Recommendations made by the consultant and any test results will be sent to your referring physician. _______________________________________________________________ 

## 2019-10-26 NOTE — Progress Notes (Signed)
Iron infusion given per orders. Patient tolerated it well without problems. Vitals stable and discharged home from clinic ambulatory. Follow up as scheduled.  

## 2019-10-30 ENCOUNTER — Other Ambulatory Visit: Payer: Self-pay

## 2019-10-31 ENCOUNTER — Encounter (HOSPITAL_COMMUNITY): Payer: Self-pay

## 2019-10-31 ENCOUNTER — Inpatient Hospital Stay (HOSPITAL_COMMUNITY): Payer: 59

## 2019-10-31 VITALS — BP 162/82 | HR 68 | Temp 97.5°F | Resp 18

## 2019-10-31 DIAGNOSIS — D500 Iron deficiency anemia secondary to blood loss (chronic): Secondary | ICD-10-CM

## 2019-10-31 DIAGNOSIS — D509 Iron deficiency anemia, unspecified: Secondary | ICD-10-CM | POA: Diagnosis not present

## 2019-10-31 MED ORDER — SODIUM CHLORIDE 0.9 % IV SOLN: Freq: Once | INTRAVENOUS | Status: AC

## 2019-10-31 MED ORDER — SODIUM CHLORIDE 0.9 % IV SOLN
200.0000 mg | Freq: Once | INTRAVENOUS | Status: AC
  Administered 2019-10-31: 200 mg via INTRAVENOUS
  Filled 2019-10-31: qty 10

## 2019-10-31 NOTE — Progress Notes (Signed)
Iron infusion given today per orders.  Patient tolerated it well without problems. Vitals stable and discharged home from clinic ambulatory. Follow up as scheduled.  

## 2019-10-31 NOTE — Patient Instructions (Signed)
[facility] at [facility]  Discharge Instructions:   _______________________________________________________________  Thank you for choosing [facility] at [facility] to provide your oncology and hematology care.  To afford each patient quality time with our providers, please arrive at least 15 minutes before your scheduled appointment.  You need to re-schedule your appointment if you arrive 10 or more minutes late.  We strive to give you quality time with our providers, and arriving late affects you and other patients whose appointments are after yours.  Also, if you no show three or more times for appointments you may be dismissed from the clinic.  Again, thank you for choosing [facility] at [facility]. Our hope is that these requests will allow you access to exceptional care and in a timely manner. _______________________________________________________________  If you have questions after your visit, please contact our office at [phone] between the hours of 8:30 a.m. and 5:00 p.m. Voicemails left after 4:30 p.m. will not be returned until the following business day. _______________________________________________________________  For prescription refill requests, have your pharmacy contact our office. _______________________________________________________________  Recommendations made by the consultant and any test results will be sent to your referring physician. _______________________________________________________________ 

## 2019-11-03 ENCOUNTER — Other Ambulatory Visit: Payer: Self-pay

## 2019-11-03 ENCOUNTER — Encounter (HOSPITAL_COMMUNITY): Payer: Self-pay

## 2019-11-03 ENCOUNTER — Inpatient Hospital Stay (HOSPITAL_COMMUNITY): Payer: 59

## 2019-11-03 ENCOUNTER — Ambulatory Visit (HOSPITAL_COMMUNITY): Payer: 59

## 2019-11-03 VITALS — BP 160/80 | HR 80 | Temp 97.6°F | Resp 18

## 2019-11-03 DIAGNOSIS — D509 Iron deficiency anemia, unspecified: Secondary | ICD-10-CM | POA: Diagnosis not present

## 2019-11-03 DIAGNOSIS — D500 Iron deficiency anemia secondary to blood loss (chronic): Secondary | ICD-10-CM

## 2019-11-03 MED ORDER — SODIUM CHLORIDE 0.9 % IV SOLN: Freq: Once | INTRAVENOUS | Status: AC

## 2019-11-03 MED ORDER — SODIUM CHLORIDE 0.9 % IV SOLN
200.0000 mg | INTRAVENOUS | Status: DC
  Administered 2019-11-03: 200 mg via INTRAVENOUS
  Filled 2019-11-03: qty 10

## 2019-11-03 NOTE — Progress Notes (Signed)
Troy [name] tolerated Venofer infusion well without complaints or incident. Peripheral IV site checked with positive blood return noted prior to and after infusion. P[t did not want to wait the entire 30 minutes after the Venofer infusion complete. VSS Pt discharged self ambulatory in satisfactory condition

## 2019-11-03 NOTE — Patient Instructions (Signed)
[facility] at [facility] Discharge Instructions  Received Venofer infusion today. Follow-up as scheduled. Call clinic for any questions or concerns   Thank you for choosing [facility] at [facility] to provide your oncology and hematology care.  To afford each patient quality time with our provider, please arrive at least 15 minutes before your scheduled appointment time.   If you have a lab appointment with the Cancer Center please come in thru the Main Entrance and check in at the main information desk.  You need to re-schedule your appointment should you arrive 10 or more minutes late.  We strive to give you quality time with our providers, and arriving late affects you and other patients whose appointments are after yours.  Also, if you no show three or more times for appointments you may be dismissed from the clinic at the providers discretion.     Again, thank you for choosing [facility].  Our hope is that these requests will decrease the amount of time that you wait before being seen by our physicians.       _____________________________________________________________  Should you have questions after your visit to [facility], please contact our office at [phone] between the hours of 8:00 a.m. and 4:30 p.m.  Voicemails left after 4:00 p.m. will not be returned until the following business day.  For prescription refill requests, have your pharmacy contact our office and allow 72 hours.    Due to Covid, you will need to wear a mask upon entering the hospital. If you do not have a mask, a mask will be given to you at the Main Entrance upon arrival. For doctor visits, patients may have 1 support person with them. For treatment visits, patients can not have anyone with them due to social distancing guidelines and our immunocompromised population.     

## 2019-11-08 ENCOUNTER — Inpatient Hospital Stay (HOSPITAL_COMMUNITY): Payer: 59

## 2019-11-08 ENCOUNTER — Ambulatory Visit (HOSPITAL_COMMUNITY): Payer: 59

## 2019-11-08 ENCOUNTER — Encounter (HOSPITAL_COMMUNITY): Payer: Self-pay

## 2019-11-08 ENCOUNTER — Other Ambulatory Visit: Payer: Self-pay

## 2019-11-08 VITALS — BP 163/90 | HR 63 | Temp 97.5°F | Resp 18

## 2019-11-08 DIAGNOSIS — D500 Iron deficiency anemia secondary to blood loss (chronic): Secondary | ICD-10-CM

## 2019-11-08 DIAGNOSIS — D509 Iron deficiency anemia, unspecified: Secondary | ICD-10-CM | POA: Diagnosis not present

## 2019-11-08 MED ORDER — SODIUM CHLORIDE 0.9 % IV SOLN: Freq: Once | INTRAVENOUS | Status: AC

## 2019-11-08 MED ORDER — SODIUM CHLORIDE 0.9 % IV SOLN
200.0000 mg | Freq: Once | INTRAVENOUS | Status: AC
  Administered 2019-11-08: 200 mg via INTRAVENOUS
  Filled 2019-11-08: qty 10

## 2019-11-08 NOTE — Patient Instructions (Signed)
[facility] at [facility]  Discharge Instructions:   _______________________________________________________________  Thank you for choosing [facility] at [facility] to provide your oncology and hematology care.  To afford each patient quality time with our providers, please arrive at least 15 minutes before your scheduled appointment.  You need to re-schedule your appointment if you arrive 10 or more minutes late.  We strive to give you quality time with our providers, and arriving late affects you and other patients whose appointments are after yours.  Also, if you no show three or more times for appointments you may be dismissed from the clinic.  Again, thank you for choosing [facility] at [facility]. Our hope is that these requests will allow you access to exceptional care and in a timely manner. _______________________________________________________________  If you have questions after your visit, please contact our office at [phone] between the hours of 8:30 a.m. and 5:00 p.m. Voicemails left after 4:30 p.m. will not be returned until the following business day. _______________________________________________________________  For prescription refill requests, have your pharmacy contact our office. _______________________________________________________________  Recommendations made by the consultant and any test results will be sent to your referring physician. _______________________________________________________________ 

## 2019-11-08 NOTE — Progress Notes (Signed)
Iron given per orders. Patient tolerated it well without problems. Vitals stable and discharged home from clinic ambulatory. Follow up as scheduled.  

## 2019-11-27 ENCOUNTER — Other Ambulatory Visit: Payer: Self-pay

## 2019-11-27 ENCOUNTER — Inpatient Hospital Stay (HOSPITAL_COMMUNITY): Payer: 59 | Attending: Hematology

## 2019-11-27 DIAGNOSIS — D500 Iron deficiency anemia secondary to blood loss (chronic): Secondary | ICD-10-CM | POA: Diagnosis not present

## 2019-11-27 DIAGNOSIS — Q2733 Arteriovenous malformation of digestive system vessel: Secondary | ICD-10-CM | POA: Insufficient documentation

## 2019-11-27 LAB — CBC WITH DIFFERENTIAL/PLATELET
Abs Immature Granulocytes: 0.02 10*3/uL (ref 0.00–0.07)
Basophils Absolute: 0 10*3/uL (ref 0.0–0.1)
Basophils Relative: 1 %
Eosinophils Absolute: 0.2 10*3/uL (ref 0.0–0.5)
Eosinophils Relative: 4 %
HCT: 41.8 % (ref 39.0–52.0)
Hemoglobin: 11.9 g/dL — ABNORMAL LOW (ref 13.0–17.0)
Immature Granulocytes: 0 %
Lymphocytes Relative: 33 %
Lymphs Abs: 1.5 10*3/uL (ref 0.7–4.0)
MCH: 23 pg — ABNORMAL LOW (ref 26.0–34.0)
MCHC: 28.5 g/dL — ABNORMAL LOW (ref 30.0–36.0)
MCV: 80.7 fL (ref 80.0–100.0)
Monocytes Absolute: 0.4 10*3/uL (ref 0.1–1.0)
Monocytes Relative: 8 %
Neutro Abs: 2.4 10*3/uL (ref 1.7–7.7)
Neutrophils Relative %: 54 %
Platelets: 297 10*3/uL (ref 150–400)
RBC: 5.18 MIL/uL (ref 4.22–5.81)
RDW: 22.9 % — ABNORMAL HIGH (ref 11.5–15.5)
WBC: 4.5 10*3/uL (ref 4.0–10.5)
nRBC: 0 % (ref 0.0–0.2)

## 2019-11-27 LAB — IRON AND TIBC
Iron: 30 ug/dL — ABNORMAL LOW (ref 45–182)
Saturation Ratios: 6 % — ABNORMAL LOW (ref 17.9–39.5)
TIBC: 510 ug/dL — ABNORMAL HIGH (ref 250–450)
UIBC: 480 ug/dL

## 2019-11-27 LAB — VITAMIN D 25 HYDROXY (VIT D DEFICIENCY, FRACTURES): Vit D, 25-Hydroxy: 24.79 ng/mL — ABNORMAL LOW (ref 30–100)

## 2019-11-27 LAB — FERRITIN: Ferritin: 32 ng/mL (ref 24–336)

## 2019-11-27 LAB — VITAMIN B12: Vitamin B-12: 592 pg/mL (ref 180–914)

## 2019-11-30 ENCOUNTER — Other Ambulatory Visit (HOSPITAL_COMMUNITY): Payer: 59

## 2019-12-04 ENCOUNTER — Inpatient Hospital Stay (HOSPITAL_COMMUNITY): Payer: 59 | Attending: Hematology | Admitting: Hematology

## 2019-12-04 ENCOUNTER — Other Ambulatory Visit: Payer: Self-pay

## 2019-12-04 ENCOUNTER — Encounter (HOSPITAL_COMMUNITY): Payer: Self-pay | Admitting: Hematology

## 2019-12-04 VITALS — BP 183/94 | HR 72 | Temp 97.9°F | Resp 18 | Wt 215.0 lb

## 2019-12-04 DIAGNOSIS — E538 Deficiency of other specified B group vitamins: Secondary | ICD-10-CM | POA: Diagnosis not present

## 2019-12-04 DIAGNOSIS — Z800 Family history of malignant neoplasm of digestive organs: Secondary | ICD-10-CM | POA: Insufficient documentation

## 2019-12-04 DIAGNOSIS — D508 Other iron deficiency anemias: Secondary | ICD-10-CM | POA: Diagnosis not present

## 2019-12-04 DIAGNOSIS — D500 Iron deficiency anemia secondary to blood loss (chronic): Secondary | ICD-10-CM

## 2019-12-04 DIAGNOSIS — Z79899 Other long term (current) drug therapy: Secondary | ICD-10-CM | POA: Diagnosis not present

## 2019-12-04 DIAGNOSIS — E559 Vitamin D deficiency, unspecified: Secondary | ICD-10-CM | POA: Diagnosis not present

## 2019-12-04 DIAGNOSIS — Z8249 Family history of ischemic heart disease and other diseases of the circulatory system: Secondary | ICD-10-CM | POA: Insufficient documentation

## 2019-12-04 DIAGNOSIS — I10 Essential (primary) hypertension: Secondary | ICD-10-CM | POA: Insufficient documentation

## 2019-12-04 NOTE — Patient Instructions (Signed)
[facility] at [facility] Discharge Instructions  You were seen today by Dr. Ellin Saba. He went over your recent lab results. He will schedule you for another round of Venofer. He will see you back in 3 months for labs and follow up.   Thank you for choosing [facility] at [facility] to provide your oncology and hematology care.  To afford each patient quality time with our provider, please arrive at least 15 minutes before your scheduled appointment time.   If you have a lab appointment with the Cancer Center please come in thru the  Main Entrance and check in at the main information desk  You need to re-schedule your appointment should you arrive 10 or more minutes late.  We strive to give you quality time with our providers, and arriving late affects you and other patients whose appointments are after yours.  Also, if you no show three or more times for appointments you may be dismissed from the clinic at the providers discretion.     Again, thank you for choosing [facility].  Our hope is that these requests will decrease the amount of time that you wait before being seen by our physicians.       _____________________________________________________________  Should you have questions after your visit to [facility], please contact our office at [phone] between the hours of 8:00 a.m. and 4:30 p.m.  Voicemails left after 4:00 p.m. will not be returned until the following business day.  For prescription refill requests, have your pharmacy contact our office and allow 72 hours.    Cancer Center Support Programs:   > Cancer Support Group  2nd Tuesday of the month 1pm-2pm, Journey Room

## 2019-12-04 NOTE — Assessment & Plan Note (Addendum)
1.  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 -EGD/colonoscopy on 12/01/2016 showed AVMs and Cameron's erosions.  Capsule study on 12/23/2016. -5 infusions of Venofer from 10/20/2019 through 11/08/2019. -We reviewed labs from 11/27/2019.  Ferritin is 32, percent saturation 6.  Hemoglobin improved 11.9. -He still complains of feeling very tired. -I have recommended 5 more infusions of Venofer. -I plan to see him back in 3 months for follow-up with repeat labs.  He was told to come back sooner if he develops any severe fatigue.  2.  Vitamin B12 deficiency: -He is taking B12 1 mg daily.  His B12 level is normal at 592.  3.  Vitamin D deficiency: -He is taking vitamin D 3000 units daily.  Vitamin D level is 24.79.

## 2019-12-04 NOTE — Progress Notes (Signed)
[facility] [address], [place] [address]   CLINIC:  Medical Oncology/Hematology  PCP:  Gareth Morgan, MD [address] [place] [address] [phone]   REASON FOR VISIT:  Follow-up for iron deficiency, B12 deficiency and vitamin D deficiency.  CURRENT THERAPY: Intermittent Venofer infusions.  BRIEF ONCOLOGIC HISTORY:  Oncology History   No history exists.     CANCER STAGING: Cancer Staging No matching staging information was found for the patient.   INTERVAL HISTORY:  Troy [name] 59 y.o. male seen for follow-up of iron deficiency anemia, B12 deficiency and vitamin D deficiency.  Has been taking vitamin B12 1 mg daily.  Last iron infusion was on 11/08/2019.  Also taking vitamin D supplements.  Has very slight bleeding if he is constipated which is not common.  He still continues to have some tiredness despite iron infusion.  He reportedly works 12-hour shifts and feels drained after work.    REVIEW OF SYSTEMS:  Review of Systems  Constitutional: Positive for fatigue.  All other systems reviewed and are negative.    PAST MEDICAL/SURGICAL HISTORY:  Past Medical History:  Diagnosis Date  . Alcohol dependence (HCC)   . Anxiety   . Chest pain   . GERD (gastroesophageal reflux disease)   . Hypertension   . Insomnia   . Iron deficiency anemia 11/25/2016  . Plantar fascial fibromatosis   . Psoriasis    Past Surgical History:  Procedure Laterality Date  . BIOPSY  12/01/2016   Procedure: BIOPSY;  Surgeon: West Bali, MD;  Location: [facility];  Service: Endoscopy;;  gastric duodenal hiatal hernia pouch  . COLONOSCOPY WITH PROPOFOL N/A 12/01/2016   Procedure: COLONOSCOPY WITH PROPOFOL;  Surgeon: West Bali, MD;  Location: [facility];  Service: Endoscopy;  Laterality: N/A;  1:45 pm  . ESOPHAGOGASTRODUODENOSCOPY  2004   Dr. 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 ESOPHAGOGASTRODUODENOSCOPY (EGD)  WITH PROPOFOL N/A 12/01/2016   Procedure: ESOPHAGOGASTRODUODENOSCOPY (EGD) WITH PROPOFOL;  Surgeon: West Bali, MD;  Location: [facility];  Service: Endoscopy;  Laterality: N/A;  . GIVENS CAPSULE STUDY N/A 12/23/2016   Procedure: GIVENS CAPSULE STUDY;  Surgeon: West Bali, MD;  Location: [facility];  Service: Endoscopy;  Laterality: N/A;  arrive at 7:00am for 7:30am appt time  . HEMORRHOID SURGERY     residual skin tags  . right ankle    . UMBILICAL HERNIA REPAIR N/A 06/07/2014   Procedure: HERNIA REPAIR UMBILICAL ADULT;  Surgeon: Marlane Hatcher, MD;  Location: [facility];  Service: General;  Laterality: N/A;     SOCIAL HISTORY:  Social History   Socioeconomic History  . Marital status: Married    Spouse name: Not on file  . Number of children: Not on file  . Years of education: Not on file  . Highest education level: Not on file  Occupational History  . Not on file  Tobacco Use  . Smoking status: Never Smoker  . Smokeless tobacco: Never Used  Substance and Sexual Activity  . Alcohol use: Yes    Comment: 6-12 beers at a time sometimes daily.   . Drug use: No  . Sexual activity: Yes  Other Topics Concern  . Not on file  Social History Narrative  . Not on file   Social Determinants of Health   Financial Resource Strain:   . Difficulty of Paying Living Expenses: Not on file  Food Insecurity:   .  Worried About Charity fundraiser in the Last Year: Not on file  . Ran Out of Food in the Last Year: Not on file  Transportation Needs:   . Lack of Transportation (Medical): Not on file  . Lack of Transportation (Non-Medical): Not on file  Physical Activity:   . Days of Exercise per Week: Not on file  . Minutes of Exercise per Session: Not on file  Stress:   . Feeling of Stress : Not on file  Social Connections:   . Frequency of Communication with Friends and Family: Not on file  . Frequency of Social Gatherings with Friends and Family: Not on file  . Attends  Religious Services: Not on file  . Active Member of Clubs or Organizations: Not on file  . Attends Archivist Meetings: Not on file  . Marital Status: Not on file  Intimate Partner Violence:   . Fear of Current or Ex-Partner: Not on file  . Emotionally Abused: Not on file  . Physically Abused: Not on file  . Sexually Abused: Not on file    FAMILY HISTORY:  Family History  Problem Relation Age of Onset  . Heart disease Mother        stent placement  . Heart attack Father        died from MR age 67  . Heart attack Maternal Grandmother   . Colon cancer Paternal Grandfather     CURRENT MEDICATIONS:  Outpatient Encounter Medications as of 12/04/2019  Medication Sig  . Cyanocobalamin (B-12) 5000 MCG SUBL Place 5,000 mcg under the tongue daily.  . Polysacch Fe Cmp-Fe Heme Poly (BIFERA) 28 MG TABS 1 po bid  . lisinopril (PRINIVIL,ZESTRIL) 20 MG tablet Take 10 mg by mouth daily.   Marland Kitchen omeprazole (PRILOSEC) 20 MG capsule 1 PO 30 mins prior to breakfast (Patient not taking: Reported on 12/04/2019)  . [DISCONTINUED] ferrous sulfate 325 (65 FE) MG EC tablet Take 325 mg by mouth 2 (two) times daily.   No facility-administered encounter medications on file as of 12/04/2019.    ALLERGIES:  No Known Allergies   PHYSICAL EXAM:  ECOG Performance status: 1  Vitals:   12/04/19 1510  BP: (!) 183/94  Pulse: 72  Resp: 18  Temp: 97.9 F (36.6 C)  SpO2: 98%   Filed Weights   12/04/19 1510  Weight: 215 lb (97.5 kg)    Physical Exam Vitals reviewed.  Constitutional:      Appearance: Normal appearance.  Cardiovascular:     Rate and Rhythm: Normal rate and regular rhythm.     Heart sounds: Normal heart sounds.  Pulmonary:     Effort: Pulmonary effort is normal.     Breath sounds: Normal breath sounds.  Abdominal:     General: There is no distension.     Palpations: Abdomen is soft. There is no mass.  Skin:    General: Skin is warm.  Neurological:     General: No focal  deficit present.     Mental Status: He is alert and oriented to person, place, and time.  Psychiatric:        Mood and Affect: Mood normal.        Behavior: Behavior normal.      LABORATORY DATA:  I have reviewed the labs as listed.  CBC    Component Value Date/Time   WBC 4.5 11/27/2019 1538   RBC 5.18 11/27/2019 1538   HGB 11.9 (L) 11/27/2019 1538   HCT 41.8 11/27/2019  1538   PLT 297 11/27/2019 1538   MCV 80.7 11/27/2019 1538   MCH 23.0 (L) 11/27/2019 1538   MCHC 28.5 (L) 11/27/2019 1538   RDW 22.9 (H) 11/27/2019 1538   LYMPHSABS 1.5 11/27/2019 1538   MONOABS 0.4 11/27/2019 1538   EOSABS 0.2 11/27/2019 1538   BASOSABS 0.0 11/27/2019 1538   CMP Latest Ref Rng & Units 10/09/2019 04/30/2017 03/09/2017  Glucose 70 - 99 mg/dL 263(Z) 858(I) 502(D)  BUN 6 - 20 mg/dL 18 16 18   Creatinine 0.61 - 1.24 mg/dL 7.41 2.87  Sodium 135 - 145 mmol/L 142 141 139  Potassium 3.5 - 5.1 mmol/L 4.2 4.2 4.2  Chloride 98 - 111 mmol/L 104 106 104  CO2 22 - 32 mmol/L 28 25 27   Calcium 8.9 - 10.3 mg/dL 9.2 9.1 9.3  Total Protein 6.5 - 8.1 g/dL 7.8 7.8 -  Total Bilirubin 0.3 - 1.2 mg/dL 0.7 1.0 -  Alkaline Phos 38 - 126 U/L 59 65 -  AST 15 - 41 U/L 18 24 -  ALT 0 - 44 U/L 21 29 -       DIAGNOSTIC IMAGING:  I have independently reviewed the scans and discussed with the patient.     ASSESSMENT & PLAN:   Iron deficiency anemia 1.  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 -EGD/colonoscopy on 12/01/2016 showed AVMs and Cameron's erosions.  Capsule study on 12/23/2016. -5 infusions of Venofer from 10/20/2019 through 11/08/2019. -We reviewed labs from 11/27/2019.  Ferritin is 32, percent saturation 6.  Hemoglobin improved 11.9. -He still complains of feeling very tired. -I have recommended 5 more infusions of Venofer. -I plan to see him back in 3 months for follow-up with repeat labs.  He was told to come back sooner if he develops  any severe fatigue.  2.  Vitamin B12 deficiency: -He is taking B12 1 mg daily.  His B12 level is normal at 592.  3.  Vitamin D deficiency: -He is taking vitamin D 3000 units daily.  Vitamin D level is 24.79.      Orders placed this encounter:  Orders Placed This Encounter  Procedures  . CBC with Differential/Platelet  . Comprehensive metabolic panel  . Iron and TIBC  . Ferritin  . Vitamin B12  . Vitamin D 25 hydroxy      11/10/2019, MD [facility] [phone]

## 2019-12-06 ENCOUNTER — Inpatient Hospital Stay (HOSPITAL_COMMUNITY): Payer: 59

## 2019-12-06 ENCOUNTER — Other Ambulatory Visit: Payer: Self-pay

## 2019-12-06 ENCOUNTER — Encounter (HOSPITAL_COMMUNITY): Payer: Self-pay

## 2019-12-06 VITALS — BP 150/84 | HR 74 | Temp 97.1°F | Resp 18

## 2019-12-06 DIAGNOSIS — D500 Iron deficiency anemia secondary to blood loss (chronic): Secondary | ICD-10-CM

## 2019-12-06 MED ORDER — SODIUM CHLORIDE 0.9 % IV SOLN
200.0000 mg | Freq: Once | INTRAVENOUS | Status: AC
  Administered 2019-12-06: 200 mg via INTRAVENOUS
  Filled 2019-12-06: qty 200

## 2019-12-06 MED ORDER — SODIUM CHLORIDE 0.9 % IV SOLN: INTRAVENOUS | Status: DC

## 2019-12-06 NOTE — Progress Notes (Signed)
Patient tolerated iron infusion with no complaints voiced.  Peripheral IV site clean and dry with good blood return noted before and after infusion.  Band aid applied.  VSS with discharge and left ambulatory with no s/s of distress noted.  

## 2019-12-11 ENCOUNTER — Encounter (HOSPITAL_COMMUNITY): Payer: Self-pay

## 2019-12-11 ENCOUNTER — Inpatient Hospital Stay (HOSPITAL_COMMUNITY): Payer: 59

## 2019-12-11 ENCOUNTER — Other Ambulatory Visit: Payer: Self-pay

## 2019-12-11 VITALS — BP 173/96 | HR 70 | Temp 97.7°F | Resp 18

## 2019-12-11 DIAGNOSIS — D500 Iron deficiency anemia secondary to blood loss (chronic): Secondary | ICD-10-CM

## 2019-12-11 MED ORDER — SODIUM CHLORIDE 0.9 % IV SOLN
200.0000 mg | Freq: Once | INTRAVENOUS | Status: AC
  Administered 2019-12-11: 200 mg via INTRAVENOUS
  Filled 2019-12-11: qty 200

## 2019-12-11 MED ORDER — SODIUM CHLORIDE 0.9 % IV SOLN: INTRAVENOUS | Status: DC

## 2019-12-11 NOTE — Patient Instructions (Signed)
[facility]  Discharge Instructions:   _______________________________________________________________  Thank you for choosing [facility] at [facility] to provide your oncology and hematology care.  To afford each patient quality time with our providers, please arrive at least 15 minutes before your scheduled appointment.  You need to re-schedule your appointment if you arrive 10 or more minutes late.  We strive to give you quality time with our providers, and arriving late affects you and other patients whose appointments are after yours.  Also, if you no show three or more times for appointments you may be dismissed from the clinic.  Again, thank you for choosing [facility] at [facility]. Our hope is that these requests will allow you access to exceptional care and in a timely manner. _______________________________________________________________  If you have questions after your visit, please contact our office at [phone] between the hours of 8:30 a.m. and 5:00 p.m. Voicemails left after 4:30 p.m. will not be returned until the following business day. _______________________________________________________________  For prescription refill requests, have your pharmacy contact our office. _______________________________________________________________  Recommendations made by the consultant and any test results will be sent to your referring physician. _______________________________________________________________ 

## 2019-12-11 NOTE — Progress Notes (Signed)
Iron given per orders. Patient tolerated it well without problems. Vitals stable and discharged home from clinic ambulatory. Follow up as scheduled.  

## 2019-12-15 ENCOUNTER — Other Ambulatory Visit: Payer: Self-pay

## 2019-12-15 ENCOUNTER — Inpatient Hospital Stay (HOSPITAL_COMMUNITY): Payer: 59

## 2019-12-15 ENCOUNTER — Encounter (HOSPITAL_COMMUNITY): Payer: Self-pay

## 2019-12-15 VITALS — BP 154/80 | HR 66 | Temp 96.9°F | Resp 18

## 2019-12-15 DIAGNOSIS — D500 Iron deficiency anemia secondary to blood loss (chronic): Secondary | ICD-10-CM

## 2019-12-15 MED ORDER — SODIUM CHLORIDE 0.9 % IV SOLN: INTRAVENOUS | Status: DC

## 2019-12-15 MED ORDER — SODIUM CHLORIDE 0.9 % IV SOLN
200.0000 mg | Freq: Once | INTRAVENOUS | Status: AC
  Administered 2019-12-15: 200 mg via INTRAVENOUS
  Filled 2019-12-15: qty 200

## 2019-12-15 NOTE — Patient Instructions (Signed)
[facility] at [facility]  Discharge Instructions:   _______________________________________________________________  Thank you for choosing [facility] at [facility] to provide your oncology and hematology care.  To afford each patient quality time with our providers, please arrive at least 15 minutes before your scheduled appointment.  You need to re-schedule your appointment if you arrive 10 or more minutes late.  We strive to give you quality time with our providers, and arriving late affects you and other patients whose appointments are after yours.  Also, if you no show three or more times for appointments you may be dismissed from the clinic.  Again, thank you for choosing [facility] at [facility]. Our hope is that these requests will allow you access to exceptional care and in a timely manner. _______________________________________________________________  If you have questions after your visit, please contact our office at [phone] between the hours of 8:30 a.m. and 5:00 p.m. Voicemails left after 4:30 p.m. will not be returned until the following business day. _______________________________________________________________  For prescription refill requests, have your pharmacy contact our office. _______________________________________________________________  Recommendations made by the consultant and any test results will be sent to your referring physician. _______________________________________________________________ 

## 2019-12-15 NOTE — Progress Notes (Signed)
Iron given per orders. Patient tolerated it well without problems. Vitals stable and discharged home from clinic ambulatory. Follow up as scheduled.  

## 2019-12-20 ENCOUNTER — Inpatient Hospital Stay (HOSPITAL_COMMUNITY): Payer: 59 | Attending: Nurse Practitioner

## 2019-12-20 ENCOUNTER — Other Ambulatory Visit: Payer: Self-pay

## 2019-12-20 VITALS — BP 175/96 | HR 71 | Temp 97.7°F | Resp 18

## 2019-12-20 DIAGNOSIS — E538 Deficiency of other specified B group vitamins: Secondary | ICD-10-CM | POA: Diagnosis not present

## 2019-12-20 DIAGNOSIS — I159 Secondary hypertension, unspecified: Secondary | ICD-10-CM

## 2019-12-20 DIAGNOSIS — D500 Iron deficiency anemia secondary to blood loss (chronic): Secondary | ICD-10-CM | POA: Insufficient documentation

## 2019-12-20 DIAGNOSIS — K5520 Angiodysplasia of colon without hemorrhage: Secondary | ICD-10-CM | POA: Diagnosis present

## 2019-12-20 DIAGNOSIS — E559 Vitamin D deficiency, unspecified: Secondary | ICD-10-CM | POA: Insufficient documentation

## 2019-12-20 MED ORDER — SODIUM CHLORIDE 0.9 % IV SOLN: INTRAVENOUS | Status: DC

## 2019-12-20 MED ORDER — SODIUM CHLORIDE 0.9 % IV SOLN
200.0000 mg | Freq: Once | INTRAVENOUS | Status: AC
  Administered 2019-12-20: 200 mg via INTRAVENOUS
  Filled 2019-12-20: qty 200

## 2019-12-20 MED ORDER — CLONIDINE HCL 0.1 MG PO TABS: 0.2000 mg | ORAL_TABLET | Freq: Once | ORAL | Status: DC

## 2019-12-20 NOTE — Patient Instructions (Signed)
[facility] at [facility]  Discharge Instructions:   _______________________________________________________________  Thank you for choosing [facility] at [facility] to provide your oncology and hematology care.  To afford each patient quality time with our providers, please arrive at least 15 minutes before your scheduled appointment.  You need to re-schedule your appointment if you arrive 10 or more minutes late.  We strive to give you quality time with our providers, and arriving late affects you and other patients whose appointments are after yours.  Also, if you no show three or more times for appointments you may be dismissed from the clinic.  Again, thank you for choosing [facility] at [facility]. Our hope is that these requests will allow you access to exceptional care and in a timely manner. _______________________________________________________________  If you have questions after your visit, please contact our office at [phone] between the hours of 8:30 a.m. and 5:00 p.m. Voicemails left after 4:30 p.m. will not be returned until the following business day. _______________________________________________________________  For prescription refill requests, have your pharmacy contact our office. _______________________________________________________________  Recommendations made by the consultant and any test results will be sent to your referring physician. _______________________________________________________________ 

## 2019-12-20 NOTE — Progress Notes (Signed)
Patient presents today for Venofer infusion. BP on arrival elevated 177/95. Patient denies any headache or blurred vision. Patient states he took his BP medication last night. Recheck BP 164/90.  Message sent to [facility] NP. Clonidine 0.2 mg ordered per NP. After reviewing BP recheck message received to hold the Clonidine 0.2mg  at this time.   Venofer given today per MD orders. Tolerated infusion without adverse affects. Vital signs stable. BP elevated today. Patient teaching performed pertaining to BP medication. No complaints at this time. Discharged from clinic ambulatory. F/U with [facility] as scheduled.

## 2019-12-26 ENCOUNTER — Ambulatory Visit (HOSPITAL_COMMUNITY): Payer: 59

## 2019-12-29 ENCOUNTER — Other Ambulatory Visit: Payer: Self-pay

## 2019-12-29 ENCOUNTER — Inpatient Hospital Stay (HOSPITAL_COMMUNITY): Payer: 59

## 2019-12-29 ENCOUNTER — Encounter (HOSPITAL_COMMUNITY): Payer: Self-pay

## 2019-12-29 VITALS — BP 156/80 | HR 70 | Temp 97.1°F | Resp 18

## 2019-12-29 DIAGNOSIS — D500 Iron deficiency anemia secondary to blood loss (chronic): Secondary | ICD-10-CM | POA: Diagnosis not present

## 2019-12-29 MED ORDER — SODIUM CHLORIDE 0.9 % IV SOLN: Freq: Once | INTRAVENOUS | Status: AC

## 2019-12-29 MED ORDER — SODIUM CHLORIDE 0.9% FLUSH
10.0000 mL | Freq: Once | INTRAVENOUS | Status: AC | PRN
  Administered 2019-12-29: 10 mL

## 2019-12-29 MED ORDER — SODIUM CHLORIDE 0.9 % IV SOLN
200.0000 mg | Freq: Once | INTRAVENOUS | Status: AC
  Administered 2019-12-29: 200 mg via INTRAVENOUS
  Filled 2019-12-29: qty 200

## 2019-12-29 NOTE — Progress Notes (Signed)
Patient tolerated iron infusion with no complaints voiced.  Peripheral IV site clean and dry with good blood return noted before and after infusion.  Band aid applied.  VSS with discharge and left ambulatory with no s/s of distress noted.  

## 2020-02-26 ENCOUNTER — Inpatient Hospital Stay (HOSPITAL_COMMUNITY): Payer: 59 | Attending: Hematology

## 2020-02-26 ENCOUNTER — Other Ambulatory Visit: Payer: Self-pay

## 2020-02-26 DIAGNOSIS — D509 Iron deficiency anemia, unspecified: Secondary | ICD-10-CM | POA: Diagnosis not present

## 2020-02-26 DIAGNOSIS — D500 Iron deficiency anemia secondary to blood loss (chronic): Secondary | ICD-10-CM

## 2020-02-26 DIAGNOSIS — E538 Deficiency of other specified B group vitamins: Secondary | ICD-10-CM | POA: Insufficient documentation

## 2020-02-26 DIAGNOSIS — I10 Essential (primary) hypertension: Secondary | ICD-10-CM | POA: Insufficient documentation

## 2020-02-26 DIAGNOSIS — E559 Vitamin D deficiency, unspecified: Secondary | ICD-10-CM | POA: Insufficient documentation

## 2020-02-26 DIAGNOSIS — Z79899 Other long term (current) drug therapy: Secondary | ICD-10-CM | POA: Diagnosis not present

## 2020-02-26 LAB — CBC WITH DIFFERENTIAL/PLATELET
Abs Immature Granulocytes: 0.01 10*3/uL (ref 0.00–0.07)
Basophils Absolute: 0.1 10*3/uL (ref 0.0–0.1)
Basophils Relative: 1 %
Eosinophils Absolute: 0.2 10*3/uL (ref 0.0–0.5)
Eosinophils Relative: 5 %
HCT: 43.1 % (ref 39.0–52.0)
Hemoglobin: 13.6 g/dL (ref 13.0–17.0)
Immature Granulocytes: 0 %
Lymphocytes Relative: 29 %
Lymphs Abs: 1.4 10*3/uL (ref 0.7–4.0)
MCH: 27.9 pg (ref 26.0–34.0)
MCHC: 31.6 g/dL (ref 30.0–36.0)
MCV: 88.3 fL (ref 80.0–100.0)
Monocytes Absolute: 0.3 10*3/uL (ref 0.1–1.0)
Monocytes Relative: 5 %
Neutro Abs: 2.9 10*3/uL (ref 1.7–7.7)
Neutrophils Relative %: 60 %
Platelets: 291 10*3/uL (ref 150–400)
RBC: 4.88 MIL/uL (ref 4.22–5.81)
RDW: 14.9 % (ref 11.5–15.5)
WBC: 4.8 10*3/uL (ref 4.0–10.5)
nRBC: 0 % (ref 0.0–0.2)

## 2020-02-26 LAB — COMPREHENSIVE METABOLIC PANEL
ALT: 23 U/L (ref 0–44)
AST: 20 U/L (ref 15–41)
Albumin: 4.4 g/dL (ref 3.5–5.0)
Alkaline Phosphatase: 79 U/L (ref 38–126)
Anion gap: 9 (ref 5–15)
BUN: 20 mg/dL (ref 6–20)
CO2: 27 mmol/L (ref 22–32)
Calcium: 9.1 mg/dL (ref 8.9–10.3)
Chloride: 104 mmol/L (ref 98–111)
Creatinine, Ser: 0.96 mg/dL (ref 0.61–1.24)
GFR calc Af Amer: >60 mL/min (ref >60)
GFR calc non Af Amer: >60 mL/min (ref >60)
Glucose, Bld: 100 mg/dL — ABNORMAL HIGH (ref 70–99)
Potassium: 4.7 mmol/L (ref 3.5–5.1)
Sodium: 140 mmol/L (ref 135–145)
Total Bilirubin: 0.9 mg/dL (ref 0.3–1.2)
Total Protein: 7.6 g/dL (ref 6.5–8.1)

## 2020-02-26 LAB — IRON AND TIBC
Iron: 37 ug/dL — ABNORMAL LOW (ref 45–182)
Saturation Ratios: 6 % — ABNORMAL LOW (ref 17.9–39.5)
TIBC: 577 ug/dL — ABNORMAL HIGH (ref 250–450)
UIBC: 540 ug/dL

## 2020-02-26 LAB — VITAMIN B12: Vitamin B-12: 548 pg/mL (ref 180–914)

## 2020-02-26 LAB — FERRITIN: Ferritin: 12 ng/mL — ABNORMAL LOW (ref 24–336)

## 2020-02-26 LAB — VITAMIN D 25 HYDROXY (VIT D DEFICIENCY, FRACTURES): Vit D, 25-Hydroxy: 21.58 ng/mL — ABNORMAL LOW (ref 30–100)

## 2020-03-04 ENCOUNTER — Inpatient Hospital Stay (HOSPITAL_BASED_OUTPATIENT_CLINIC_OR_DEPARTMENT_OTHER): Payer: 59 | Admitting: Hematology

## 2020-03-04 ENCOUNTER — Other Ambulatory Visit: Payer: Self-pay

## 2020-03-04 ENCOUNTER — Encounter (HOSPITAL_COMMUNITY): Payer: Self-pay | Admitting: Hematology

## 2020-03-04 VITALS — BP 154/83 | HR 82 | Temp 97.5°F | Resp 16 | Wt 210.0 lb

## 2020-03-04 DIAGNOSIS — D500 Iron deficiency anemia secondary to blood loss (chronic): Secondary | ICD-10-CM

## 2020-03-04 DIAGNOSIS — D509 Iron deficiency anemia, unspecified: Secondary | ICD-10-CM | POA: Diagnosis not present

## 2020-03-04 NOTE — Patient Instructions (Signed)
[facility] Discharge Instructions  You were seen today by Dr. Ellin Saba. Dr. Ellin Saba recommends taking an iron supplement daily.  He also wants you to restart Vitamin D 3000 units day.  He went over your recent lab results. He will see you back in 3 months for labs and follow up.   Thank you for choosing [facility] at [facility] to provide your oncology and hematology care.  To afford each patient quality time with our provider, please arrive at least 15 minutes before your scheduled appointment time.   If you have a lab appointment with the Cancer Center please come in thru the  Main Entrance and check in at the main information desk  You need to re-schedule your appointment should you arrive 10 or more minutes late.  We strive to give you quality time with our providers, and arriving late affects you and other patients whose appointments are after yours.  Also, if you no show three or more times for appointments you may be dismissed from the clinic at the providers discretion.     Again, thank you for choosing [facility].  Our hope is that these requests will decrease the amount of time that you wait before being seen by our physicians.       _____________________________________________________________  Should you have questions after your visit to [facility], please contact our office at [phone] between the hours of 8:00 a.m. and 4:30 p.m.  Voicemails left after 4:00 p.m. will not be returned until the following business day.  For prescription refill requests, have your pharmacy contact our office and allow 72 hours.    Cancer Center Support Programs:   > Cancer Support Group  2nd Tuesday of the month 1pm-2pm, Journey Room

## 2020-03-04 NOTE — Assessment & Plan Note (Addendum)
1.  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 -EGD/colonoscopy on 12/01/2016 showed AVMs and Cameron erosions.  Capsule study on 12/23/2016. -CTAP from 12/28/2016 shows normal spleen. -5 infusions of Venofer from 12/06/2019 through 12/29/2019. -He is feeling very well with improved energy levels.  Denies any bleeding per rectum or melena. -We reviewed most recent labs.  Ferritin is low at 12, previously 32.  Hemoglobin improved to 13.6, previously 11.9. -I have recommended starting iron tablet daily with stool softener.  He has tolerated oral iron in the past. -We will see him back in 3 months with repeat labs.  He was told to call us sooner if he develops any severe weakness.  2.  Vitamin B12 deficiency: -His vitamin B12 level was 548. -He will continue vitamin B12 tablet daily.  3.  Vitamin D deficiency: -Vitamin D level was 21.58. -I have told him to start taking vitamin D3 2000 units daily.

## 2020-03-04 NOTE — Progress Notes (Signed)
Troy [name] [place], [place] [address]   CLINIC:  Medical Oncology/Hematology  PCP:  Troy Evens, MD [place] [address] [phone]   REASON FOR VISIT:  Follow-up for iron deficiency, B12 deficiency and vitamin D deficiency.  CURRENT THERAPY: intermittent Venofer infusions  BRIEF ONCOLOGIC HISTORY:  Oncology History   No history exists.     CANCER STAGING: Cancer Staging No matching staging information was found for the patient.   INTERVAL HISTORY:  Troy [name] 58 y.o. male seen for follow-up of iron deficiency, B12 deficiency and vitamin D deficiency.  Stopped taking vitamin D.  Reports taking B12 1 mg tablet daily.  He reports improvement in energy levels of 50%.  Appetite is also at 50%.  Denies any bleeding per rectum or melena.  No lightheadedness.  No ice pica.    REVIEW OF SYSTEMS:  Review of Systems  All other systems reviewed and are negative.    PAST MEDICAL/SURGICAL HISTORY:  Past Medical History:  Diagnosis Date  . Alcohol dependence ([place])   . Anxiety   . Chest pain   . GERD (gastroesophageal reflux disease)   . Hypertension   . Insomnia   . Iron deficiency anemia 11/25/2016  . Plantar fascial fibromatosis   . Psoriasis    Past Surgical History:  Procedure Laterality Date  . BIOPSY  12/01/2016   Procedure: BIOPSY;  Surgeon: Troy Binder, MD;  [facility];  Service: Endoscopy;;  gastric duodenal hiatal hernia pouch  . COLONOSCOPY WITH PROPOFOL N/A 12/01/2016   Procedure: COLONOSCOPY WITH PROPOFOL;  Surgeon: Troy Binder, MD;  [facility];  Service: Endoscopy;  Laterality: N/A;  1:45 pm  . ESOPHAGOGASTRODUODENOSCOPY  2004   Dr. Laural [name]: stricture at [place] junction s/p dilation, unknown path   . ESOPHAGOGASTRODUODENOSCOPY (EGD) WITH PROPOFOL N/A 12/01/2016   Procedure: ESOPHAGOGASTRODUODENOSCOPY (EGD) WITH PROPOFOL;  Surgeon: Troy Binder, MD;  [facility];   Service: Endoscopy;  Laterality: N/A;  . GIVENS CAPSULE STUDY N/A 12/23/2016   Procedure: GIVENS CAPSULE STUDY;  Surgeon: Troy Binder, MD;  [facility];  Service: Endoscopy;  Laterality: N/A;  arrive at 7:00am for 7:30am appt time  . HEMORRHOID SURGERY     residual skin tags  . right ankle    . UMBILICAL HERNIA REPAIR N/A 06/07/2014   Procedure: HERNIA REPAIR UMBILICAL ADULT;  Surgeon: Troy Ran, MD;  Location: [facility];  Service: General;  Laterality: N/A;     SOCIAL HISTORY:  Social History   Socioeconomic History  . Marital status: Married    Spouse name: Not on file  . Number of children: Not on file  . Years of education: Not on file  . Highest education level: Not on file  Occupational History  . Not on file  Tobacco Use  . Smoking status: Never Smoker  . Smokeless tobacco: Never Used  Substance and Sexual Activity  . Alcohol use: Yes    Comment: 6-12 beers at a time sometimes daily.   . Drug use: No  . Sexual activity: Yes  Other Topics Concern  . Not on file  Social History Narrative  . Not on file   Social Determinants of Health   Financial Resource Strain:   . Difficulty of Paying Living Expenses:   Food Insecurity:   . Worried About Charity fundraiser in the Last Year:   . [place] in the  Last Year:   Transportation Needs:   . Freight forwarder (Medical):   Marland Kitchen Lack of Transportation (Non-Medical):   Physical Activity:   . Days of Exercise per Week:   . Minutes of Exercise per Session:   Stress:   . Feeling of Stress :   Social Connections:   . Frequency of Communication with Friends and Family:   . Frequency of Social Gatherings with Friends and Family:   . Attends Religious Services:   . Active Member of Clubs or Organizations:   . Attends Banker Meetings:   Marland Kitchen Marital Status:   Intimate Partner Violence:   . Fear of Current or Ex-Partner:   . Emotionally Abused:   Marland Kitchen Physically Abused:   . Sexually  Abused:     FAMILY HISTORY:  Family History  Problem Relation Age of Onset  . Heart disease Mother        stent placement  . Heart attack Father        died from MR age 16  . Heart attack Maternal Grandmother   . Colon cancer Paternal Grandfather     CURRENT MEDICATIONS:  Outpatient Encounter Medications as of 03/04/2020  Medication Sig  . Cyanocobalamin (B-12) 5000 MCG SUBL Place 5,000 mcg under the tongue daily.  Marland Kitchen lisinopril (PRINIVIL,ZESTRIL) 20 MG tablet Take 10 mg by mouth daily.   Marland Kitchen omeprazole (PRILOSEC) 20 MG capsule 1 PO 30 mins prior to breakfast  . Polysacch Fe Cmp-Fe Heme Poly (BIFERA) 28 MG TABS 1 po bid   No facility-administered encounter medications on file as of 03/04/2020.    ALLERGIES:  No Known Allergies   PHYSICAL EXAM:  ECOG Performance status: 1  Vitals:   03/04/20 1530 03/04/20 1533  BP: (!) 159/100 (!) 154/83  Pulse: 82   Resp: 16   Temp: (!) 97.5 F (36.4 C)   SpO2: 99%    Filed Weights   03/04/20 1530  Weight: 210 lb (95.3 kg)    Physical Exam Vitals reviewed.  Constitutional:      Appearance: Normal appearance.  Cardiovascular:     Rate and Rhythm: Normal rate and regular rhythm.     Heart sounds: Normal heart sounds.  Pulmonary:     Effort: Pulmonary effort is normal.     Breath sounds: Normal breath sounds.  Abdominal:     General: There is no distension.     Palpations: Abdomen is soft. There is no mass.  Skin:    General: Skin is warm.  Neurological:     General: No focal deficit present.     Mental Status: He is alert and oriented to person, place, and time.  Psychiatric:        Mood and Affect: Mood normal.        Behavior: Behavior normal.      LABORATORY DATA:  I have reviewed the labs as listed.  CBC    Component Value Date/Time   WBC 4.8 02/26/2020 1312   RBC 4.88 02/26/2020 1312   HGB 13.6 02/26/2020 1312   HCT 43.1 02/26/2020 1312   PLT 291 02/26/2020 1312   MCV 88.3 02/26/2020 1312   MCH 27.9  02/26/2020 1312   MCHC 31.6 02/26/2020 1312   RDW 14.9 02/26/2020 1312   LYMPHSABS 1.4 02/26/2020 1312   MONOABS 0.3 02/26/2020 1312   EOSABS 0.2 02/26/2020 1312   BASOSABS 0.1 02/26/2020 1312   CMP Latest Ref Rng & Units 02/26/2020 10/09/2019 04/30/2017  Glucose 70 - 99  mg/dL 253(G) 644(I) 347(Q)  BUN 6 - 20 mg/dL 20 18 16   Creatinine 0.61 - 1.24 mg/dL 2.59 5.63  Sodium 135 - 145 mmol/L 140 142 141  Potassium 3.5 - 5.1 mmol/L 4.7 4.2 4.2  Chloride 98 - 111 mmol/L 104 104 106  CO2 22 - 32 mmol/L 27 28 25   Calcium 8.9 - 10.3 mg/dL 9.1 9.2 9.1  Total Protein 6.5 - 8.1 g/dL 7.6 7.8 7.8  Total Bilirubin 0.3 - 1.2 mg/dL 0.9 0.7 1.0  Alkaline Phos 38 - 126 U/L 79 59 65  AST 15 - 41 U/L 20 18 24   ALT 0 - 44 U/L 23 21 29        DIAGNOSTIC IMAGING:  I have reviewed scans.     ASSESSMENT & PLAN:   Iron deficiency anemia 1.  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 -EGD/colonoscopy on 12/01/2016 showed AVMs and Cameron erosions.  Capsule study on 12/23/2016. -CTAP from 12/28/2016 shows normal spleen. -5 infusions of Venofer from 12/06/2019 through 12/29/2019. -He is feeling very well with improved energy levels.  Denies any bleeding per rectum or melena. -We reviewed most recent labs.  Ferritin is low at 12, previously 32.  Hemoglobin improved to 13.6, previously 11.9. -I have recommended starting iron tablet daily with stool softener.  He has tolerated oral iron in the past. -We will see him back in 3 months with repeat labs.  He was told to call 02/20/2017 sooner if he develops any severe weakness.  2.  Vitamin B12 deficiency: -His vitamin B12 level was 548. -He will continue vitamin B12 tablet daily.  3.  Vitamin D deficiency: -Vitamin D level was 21.58. -I have told him to start taking vitamin D3 2000 units daily.      Orders placed this encounter:  Orders Placed This Encounter  Procedures  . CBC with Differential  .  Comprehensive metabolic panel  . Ferritin  . Iron and TIBC  . Vitamin B12  . Vitamin D 25 hydroxy      02/25/2017, MD [facility] [phone]

## 2020-05-29 ENCOUNTER — Inpatient Hospital Stay (HOSPITAL_COMMUNITY): Payer: Commercial Managed Care - PPO | Attending: Hematology

## 2020-05-29 ENCOUNTER — Other Ambulatory Visit: Payer: Self-pay

## 2020-05-29 DIAGNOSIS — Z79899 Other long term (current) drug therapy: Secondary | ICD-10-CM | POA: Insufficient documentation

## 2020-05-29 DIAGNOSIS — D500 Iron deficiency anemia secondary to blood loss (chronic): Secondary | ICD-10-CM

## 2020-05-29 DIAGNOSIS — D509 Iron deficiency anemia, unspecified: Secondary | ICD-10-CM | POA: Diagnosis present

## 2020-05-29 DIAGNOSIS — E559 Vitamin D deficiency, unspecified: Secondary | ICD-10-CM | POA: Insufficient documentation

## 2020-05-29 DIAGNOSIS — I10 Essential (primary) hypertension: Secondary | ICD-10-CM | POA: Insufficient documentation

## 2020-05-29 DIAGNOSIS — K219 Gastro-esophageal reflux disease without esophagitis: Secondary | ICD-10-CM | POA: Insufficient documentation

## 2020-05-29 DIAGNOSIS — E538 Deficiency of other specified B group vitamins: Secondary | ICD-10-CM | POA: Insufficient documentation

## 2020-05-29 LAB — IRON AND TIBC
Iron: 21 ug/dL — ABNORMAL LOW (ref 45–182)
Saturation Ratios: 4 % — ABNORMAL LOW (ref 17.9–39.5)
TIBC: 556 ug/dL — ABNORMAL HIGH (ref 250–450)
UIBC: 535 ug/dL

## 2020-05-29 LAB — CBC WITH DIFFERENTIAL/PLATELET
Abs Immature Granulocytes: 0.02 10*3/uL (ref 0.00–0.07)
Basophils Absolute: 0 10*3/uL (ref 0.0–0.1)
Basophils Relative: 1 %
Eosinophils Absolute: 0.2 10*3/uL (ref 0.0–0.5)
Eosinophils Relative: 3 %
HCT: 36.2 % — ABNORMAL LOW (ref 39.0–52.0)
Hemoglobin: 10.9 g/dL — ABNORMAL LOW (ref 13.0–17.0)
Immature Granulocytes: 0 %
Lymphocytes Relative: 27 %
Lymphs Abs: 1.4 10*3/uL (ref 0.7–4.0)
MCH: 25.2 pg — ABNORMAL LOW (ref 26.0–34.0)
MCHC: 30.1 g/dL (ref 30.0–36.0)
MCV: 83.6 fL (ref 80.0–100.0)
Monocytes Absolute: 0.3 10*3/uL (ref 0.1–1.0)
Monocytes Relative: 6 %
Neutro Abs: 3.2 10*3/uL (ref 1.7–7.7)
Neutrophils Relative %: 63 %
Platelets: 310 10*3/uL (ref 150–400)
RBC: 4.33 MIL/uL (ref 4.22–5.81)
RDW: 13.9 % (ref 11.5–15.5)
WBC: 5.1 10*3/uL (ref 4.0–10.5)
nRBC: 0 % (ref 0.0–0.2)

## 2020-05-29 LAB — COMPREHENSIVE METABOLIC PANEL
ALT: 20 U/L (ref 0–44)
AST: 17 U/L (ref 15–41)
Albumin: 4.3 g/dL (ref 3.5–5.0)
Alkaline Phosphatase: 64 U/L (ref 38–126)
Anion gap: 7 (ref 5–15)
BUN: 16 mg/dL (ref 6–20)
CO2: 25 mmol/L (ref 22–32)
Calcium: 9.2 mg/dL (ref 8.9–10.3)
Chloride: 110 mmol/L (ref 98–111)
Creatinine, Ser: 0.98 mg/dL (ref 0.61–1.24)
GFR calc Af Amer: >60 mL/min (ref >60)
GFR calc non Af Amer: >60 mL/min (ref >60)
Glucose, Bld: 119 mg/dL — ABNORMAL HIGH (ref 70–99)
Potassium: 4.5 mmol/L (ref 3.5–5.1)
Sodium: 142 mmol/L (ref 135–145)
Total Bilirubin: 0.5 mg/dL (ref 0.3–1.2)
Total Protein: 7.8 g/dL (ref 6.5–8.1)

## 2020-05-29 LAB — VITAMIN B12: Vitamin B-12: 380 pg/mL (ref 180–914)

## 2020-05-29 LAB — FERRITIN: Ferritin: 6 ng/mL — ABNORMAL LOW (ref 24–336)

## 2020-05-29 LAB — VITAMIN D 25 HYDROXY (VIT D DEFICIENCY, FRACTURES): Vit D, 25-Hydroxy: 22.88 ng/mL — ABNORMAL LOW (ref 30–100)

## 2020-06-05 ENCOUNTER — Inpatient Hospital Stay (HOSPITAL_BASED_OUTPATIENT_CLINIC_OR_DEPARTMENT_OTHER): Payer: Commercial Managed Care - PPO | Admitting: Hematology

## 2020-06-05 ENCOUNTER — Encounter (HOSPITAL_COMMUNITY): Payer: Self-pay | Admitting: Hematology

## 2020-06-05 VITALS — BP 185/98 | HR 74 | Temp 97.1°F | Resp 18 | Wt 207.8 lb

## 2020-06-05 DIAGNOSIS — D500 Iron deficiency anemia secondary to blood loss (chronic): Secondary | ICD-10-CM | POA: Diagnosis not present

## 2020-06-05 DIAGNOSIS — D509 Iron deficiency anemia, unspecified: Secondary | ICD-10-CM | POA: Diagnosis not present

## 2020-06-05 NOTE — Progress Notes (Signed)
[facility] [address], [place] [address]   CLINIC:  Medical Oncology/Hematology  PCP:  Troy Morgan, MD [address] / [place] [address]  [phone]  REASON FOR VISIT:  Follow-up for iron deficiency, B12 deficiency, and vitamin D deficiency  PRIOR THERAPY: None  CURRENT THERAPY: Intermittent Venofer infusions  INTERVAL HISTORY:  [name], a 59 y.o. male, returns for routine follow-up for his iron deficiency, B12 deficiency and vitamin D deficiency. Troy [name] was last seen on 03/04/2020.  Today he reports that he took CBD oil drops under his tongue for 1 month instead of his vitamin B12 and D. He also complains of having leg cramps after working on chopping down a tree. He checks his blood pressure at home and takes his HTN meds daily, though he admits to not taking his other meds consistently. He denies melena, hematochezia or hematuria.   REVIEW OF SYSTEMS:  Review of Systems  Constitutional: Positive for fatigue (moderate). Negative for appetite change.  Gastrointestinal: Negative for blood in stool.  Genitourinary: Negative for hematuria.   All other systems reviewed and are negative.   PAST MEDICAL/SURGICAL HISTORY:  Past Medical History:  Diagnosis Date  . Alcohol dependence ([facility])   . Anxiety   . Chest pain   . GERD (gastroesophageal reflux disease)   . Hypertension   . Insomnia   . Iron deficiency anemia 11/25/2016  . Plantar fascial fibromatosis   . Psoriasis    Past Surgical History:  Procedure Laterality Date  . BIOPSY  12/01/2016   Procedure: BIOPSY;  Surgeon: West Bali, MD;  [facility];  Service: Endoscopy;;  gastric duodenal hiatal hernia pouch  . COLONOSCOPY WITH PROPOFOL N/A 12/01/2016   Procedure: COLONOSCOPY WITH PROPOFOL;  Surgeon: West Bali, MD;  [facility];  Service: Endoscopy;  Laterality: N/A;  1:45 pm  . ESOPHAGOGASTRODUODENOSCOPY  2004   Dr. Karilyn Cota: stricture at  GE junction s/p dilation, unknown path   . ESOPHAGOGASTRODUODENOSCOPY (EGD) WITH PROPOFOL N/A 12/01/2016   Procedure: ESOPHAGOGASTRODUODENOSCOPY (EGD) WITH PROPOFOL;  Surgeon: West Bali, MD;  [facility];  Service: Endoscopy;  Laterality: N/A;  . GIVENS CAPSULE STUDY N/A 12/23/2016   Procedure: GIVENS CAPSULE STUDY;  Surgeon: West Bali, MD;  [facility];  Service: Endoscopy;  Laterality: N/A;  arrive at 7:00am for 7:30am appt time  . HEMORRHOID SURGERY     residual skin tags  . right ankle    . UMBILICAL HERNIA REPAIR N/A 06/07/2014   Procedure: HERNIA REPAIR UMBILICAL ADULT;  Surgeon: Marlane Hatcher, MD;  Location: [facility];  Service: General;  Laterality: N/A;    SOCIAL HISTORY:  Social History   Socioeconomic History  . Marital status: Married    Spouse name: Not on file  . Number of children: Not on file  . Years of education: Not on file  . Highest education level: Not on file  Occupational History  . Not on file  Tobacco Use  . Smoking status: Never Smoker  . Smokeless tobacco: Never Used  Vaping Use  . Vaping Use: Never used  Substance and Sexual Activity  . Alcohol use: Yes    Comment: 6-12 beers at a time sometimes daily.   . Drug use: No  . Sexual activity: Yes  Other Topics Concern  . Not on file  Social History Narrative  . Not on file   Social Determinants of Health  Financial Resource Strain:   . Difficulty of Paying Living Expenses:   Food Insecurity:   . Worried About Programme researcher, broadcasting/film/video in the Last Year:   . Barista in the Last Year:   Transportation Needs:   . Freight forwarder (Medical):   Marland Kitchen Lack of Transportation (Non-Medical):   Physical Activity:   . Days of Exercise per Week:   . Minutes of Exercise per Session:   Stress:   . Feeling of Stress :   Social Connections:   . Frequency of Communication with Friends and Family:   . Frequency of Social Gatherings with Friends and Family:   . Attends  Religious Services:   . Active Member of Clubs or Organizations:   . Attends Banker Meetings:   Marland Kitchen Marital Status:   Intimate Partner Violence:   . Fear of Current or Ex-Partner:   . Emotionally Abused:   Marland Kitchen Physically Abused:   . Sexually Abused:     FAMILY HISTORY:  Family History  Problem Relation Age of Onset  . Heart disease Mother        stent placement  . Heart attack Father        died from MR age 72  . Heart attack Maternal Grandmother   . Colon cancer Paternal Grandfather     CURRENT MEDICATIONS:  Current Outpatient Medications  Medication Sig Dispense Refill  . Cyanocobalamin (B-12) 5000 MCG SUBL Place 5,000 mcg under the tongue daily. 30 tablet 6  . lisinopril (PRINIVIL,ZESTRIL) 20 MG tablet Take 10 mg by mouth daily.     Marland Kitchen omeprazole (PRILOSEC) 20 MG capsule 1 PO 30 mins prior to breakfast 30 capsule 11  . Polysacch Fe Cmp-Fe Heme Poly (BIFERA) 28 MG TABS 1 po bid 60 tablet 11   No current facility-administered medications for this visit.    ALLERGIES:  No Known Allergies  PHYSICAL EXAM:  Performance status (ECOG): 1 - Symptomatic but completely ambulatory  Vitals:   06/05/20 1543  BP: (!) 185/98  Pulse: 74  Resp: 18  Temp: (!) 97.1 F (36.2 C)  SpO2: 98%   Wt Readings from Last 3 Encounters:  06/05/20 207 lb 12.8 oz (94.3 kg)  03/04/20 210 lb (95.3 kg)  12/04/19 215 lb (97.5 kg)   Physical Exam Vitals reviewed.  Constitutional:      Appearance: Normal appearance.  Cardiovascular:     Rate and Rhythm: Normal rate and regular rhythm.     Heart sounds: Normal heart sounds.  Pulmonary:     Effort: Pulmonary effort is normal.     Breath sounds: Normal breath sounds.  Abdominal:     General: There is no distension.     Palpations: Abdomen is soft. There is no mass.  Neurological:     General: No focal deficit present.     Mental Status: He is alert.  Psychiatric:        Mood and Affect: Mood normal.        Behavior: Behavior  normal.     LABORATORY DATA:  I have reviewed the labs as listed.  CBC Latest Ref Rng & Units 05/29/2020 02/26/2020 11/27/2019  WBC 4.0 - 10.5 K/uL 5.1 4.8 4.5  Hemoglobin 13.0 - 17.0 g/dL 10.9(L) 13.6 11.9(L)  Hematocrit 39 - 52 % 36.2(L) 43.1 41.8  Platelets 150 - 400 K/uL 310 291 297   CMP Latest Ref Rng & Units 05/29/2020 02/26/2020 10/09/2019  Glucose 70 - 99 mg/dL 016(W) 109(N)  103(H)  BUN 6 - 20 mg/dL 16 20 18   Creatinine 0.61 - 1.24 mg/dL 0.08 6.76  Sodium 135 - 145 mmol/L 142 140 142  Potassium 3.5 - 5.1 mmol/L 4.5 4.7 4.2  Chloride 98 - 111 mmol/L 110 104 104  CO2 22 - 32 mmol/L 25 27 28   Calcium 8.9 - 10.3 mg/dL 9.2 9.1 9.2  Total Protein 6.5 - 8.1 g/dL 7.8 7.6 7.8  Total Bilirubin 0.3 - 1.2 mg/dL 0.5 0.9 0.7  Alkaline Phos 38 - 126 U/L 64 79 59  AST 15 - 41 U/L 17 20 18   ALT 0 - 44 U/L 20 23 21       Component Value Date/Time   RBC 4.33 05/29/2020 1411   MCV 83.6 05/29/2020 1411   MCH 25.2 (L) 05/29/2020 1411   MCHC 30.1 05/29/2020 1411   RDW 13.9 05/29/2020 1411   LYMPHSABS 1.4 05/29/2020 1411   MONOABS 0.3 05/29/2020 1411   EOSABS 0.2 05/29/2020 1411   BASOSABS 0.0 05/29/2020 1411    DIAGNOSTIC IMAGING:  I have independently reviewed the scans and discussed with the patient.   ASSESSMENT:  1.  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 -EGD/colonoscopy on 12/01/2016 showed AVMs and Cameron erosions.  Capsule study on 12/23/2016. -CTAP on 12/28/2016 shows normal spleen. -He has been receiving intermittent Feraheme infusions.   PLAN:  1.  Iron deficiency anemia: -Reported feeling weak particularly on exertion. -Labs from 05/29/2020 shows ferritin 6, hemoglobin 10.9. -I have recommended Venofer 5 infusions.  He was also told to start taking iron tablet daily. -We will plan to see him back in 3 months with repeat labs.  2.  Vitamin B12 deficiency: -He will continue B12 1 mg tablet daily.  B12 level  today is 380.  3.  Vitamin D deficiency: -Vitamin D is 22.8.  He was told to take vitamin D 2000 units daily.  Check vitamin D in 3 months.  Orders placed this encounter:  No orders of the defined types were placed in this encounter.    12/03/2016, MD [facility] [phone]   I, 05/31/2020, am acting as a scribe for Dr. Doreatha Massed.  I, [facility] MD, have reviewed the above documentation for accuracy and completeness, and I agree with the above.

## 2020-06-05 NOTE — Patient Instructions (Signed)
[facility] at [facility] Discharge Instructions  You were seen today by Dr. Ellin Saba. He went over your recent results. Buy vitamin D over the counter and take 1,000 units every day. Buy vitamin B12 over the counter and take 1 mg (1,000 mcg) every day. You will be scheduled for the Venofer infusions. Dr. Ellin Saba will see you back in 3 months for labs and follow up.   Thank you for choosing [facility] at [facility] to provide your oncology and hematology care.  To afford each patient quality time with our provider, please arrive at least 15 minutes before your scheduled appointment time.   If you have a lab appointment with the Cancer Center please come in thru the Main Entrance and check in at the main information desk  You need to re-schedule your appointment should you arrive 10 or more minutes late.  We strive to give you quality time with our providers, and arriving late affects you and other patients whose appointments are after yours.  Also, if you no show three or more times for appointments you may be dismissed from the clinic at the providers discretion.     Again, thank you for choosing [facility].  Our hope is that these requests will decrease the amount of time that you wait before being seen by our physicians.       _____________________________________________________________  Should you have questions after your visit to [facility], please contact our office at [phone] between the hours of 8:00 a.m. and 4:30 p.m.  Voicemails left after 4:00 p.m. will not be returned until the following business day.  For prescription refill requests, have your pharmacy contact our office and allow 72 hours.    Cancer Center Support Programs:   > Cancer Support Group  2nd Tuesday of the month 1pm-2pm, Journey Room

## 2020-06-10 ENCOUNTER — Inpatient Hospital Stay (HOSPITAL_COMMUNITY): Payer: Commercial Managed Care - PPO

## 2020-06-10 ENCOUNTER — Encounter (HOSPITAL_COMMUNITY): Payer: Self-pay

## 2020-06-10 ENCOUNTER — Other Ambulatory Visit: Payer: Self-pay

## 2020-06-10 VITALS — BP 136/81 | HR 71 | Temp 97.5°F | Resp 18

## 2020-06-10 DIAGNOSIS — D500 Iron deficiency anemia secondary to blood loss (chronic): Secondary | ICD-10-CM

## 2020-06-10 DIAGNOSIS — D509 Iron deficiency anemia, unspecified: Secondary | ICD-10-CM | POA: Diagnosis not present

## 2020-06-10 MED ORDER — SODIUM CHLORIDE 0.9 % IV SOLN
200.0000 mg | Freq: Once | INTRAVENOUS | Status: AC
  Administered 2020-06-10: 200 mg via INTRAVENOUS
  Filled 2020-06-10: qty 200

## 2020-06-10 MED ORDER — SODIUM CHLORIDE 0.9 % IV SOLN: Freq: Once | INTRAVENOUS | Status: AC

## 2020-06-10 NOTE — Patient Instructions (Signed)
[facility] at [facility]  Discharge Instructions:   _______________________________________________________________  Thank you for choosing [facility] at [facility] to provide your oncology and hematology care.  To afford each patient quality time with our providers, please arrive at least 15 minutes before your scheduled appointment.  You need to re-schedule your appointment if you arrive 10 or more minutes late.  We strive to give you quality time with our providers, and arriving late affects you and other patients whose appointments are after yours.  Also, if you no show three or more times for appointments you may be dismissed from the clinic.  Again, thank you for choosing [facility] at [facility]. Our hope is that these requests will allow you access to exceptional care and in a timely manner. _______________________________________________________________  If you have questions after your visit, please contact our office at [phone] between the hours of 8:30 a.m. and 5:00 p.m. Voicemails left after 4:30 p.m. will not be returned until the following business day. _______________________________________________________________  For prescription refill requests, have your pharmacy contact our office. _______________________________________________________________  Recommendations made by the consultant and any test results will be sent to your referring physician. _______________________________________________________________ 

## 2020-06-10 NOTE — Progress Notes (Signed)
Iron infusion tolerated well, discharged ambulatory.

## 2020-06-14 ENCOUNTER — Inpatient Hospital Stay (HOSPITAL_COMMUNITY): Payer: Commercial Managed Care - PPO

## 2020-06-14 ENCOUNTER — Encounter (HOSPITAL_COMMUNITY): Payer: Self-pay

## 2020-06-14 VITALS — BP 134/70 | HR 73 | Temp 97.5°F | Resp 18

## 2020-06-14 DIAGNOSIS — D509 Iron deficiency anemia, unspecified: Secondary | ICD-10-CM | POA: Diagnosis not present

## 2020-06-14 DIAGNOSIS — D500 Iron deficiency anemia secondary to blood loss (chronic): Secondary | ICD-10-CM

## 2020-06-14 MED ORDER — SODIUM CHLORIDE 0.9 % IV SOLN: Freq: Once | INTRAVENOUS | Status: AC

## 2020-06-14 MED ORDER — SODIUM CHLORIDE 0.9 % IV SOLN
200.0000 mg | Freq: Once | INTRAVENOUS | Status: AC
  Administered 2020-06-14: 200 mg via INTRAVENOUS
  Filled 2020-06-14: qty 200

## 2020-06-14 NOTE — Progress Notes (Signed)
Patient presents today for Venofer infusion. Vital signs stable. Patient has no complaints of any significant changes since his last visit.   Venofer given today per MD orders. Tolerated infusion without adverse affects. Vital signs stable. No complaints at this time. Discharged from clinic ambulatory. F/U with [facility] as scheduled.

## 2020-06-14 NOTE — Patient Instructions (Signed)
[facility] at [facility]  Discharge Instructions:   _______________________________________________________________  Thank you for choosing [facility] at [facility] to provide your oncology and hematology care.  To afford each patient quality time with our providers, please arrive at least 15 minutes before your scheduled appointment.  You need to re-schedule your appointment if you arrive 10 or more minutes late.  We strive to give you quality time with our providers, and arriving late affects you and other patients whose appointments are after yours.  Also, if you no show three or more times for appointments you may be dismissed from the clinic.  Again, thank you for choosing [facility] at [facility]. Our hope is that these requests will allow you access to exceptional care and in a timely manner. _______________________________________________________________  If you have questions after your visit, please contact our office at [phone] between the hours of 8:30 a.m. and 5:00 p.m. Voicemails left after 4:30 p.m. will not be returned until the following business day. _______________________________________________________________  For prescription refill requests, have your pharmacy contact our office. _______________________________________________________________  Recommendations made by the consultant and any test results will be sent to your referring physician. _______________________________________________________________ 

## 2020-06-19 ENCOUNTER — Inpatient Hospital Stay (HOSPITAL_COMMUNITY): Payer: Commercial Managed Care - PPO | Attending: Hematology

## 2020-06-19 VITALS — BP 123/68 | HR 65 | Temp 98.6°F | Resp 77

## 2020-06-19 DIAGNOSIS — E559 Vitamin D deficiency, unspecified: Secondary | ICD-10-CM | POA: Diagnosis not present

## 2020-06-19 DIAGNOSIS — E538 Deficiency of other specified B group vitamins: Secondary | ICD-10-CM | POA: Diagnosis not present

## 2020-06-19 DIAGNOSIS — D509 Iron deficiency anemia, unspecified: Secondary | ICD-10-CM | POA: Insufficient documentation

## 2020-06-19 DIAGNOSIS — D500 Iron deficiency anemia secondary to blood loss (chronic): Secondary | ICD-10-CM

## 2020-06-19 MED ORDER — SODIUM CHLORIDE 0.9 % IV SOLN
200.0000 mg | Freq: Once | INTRAVENOUS | Status: AC
  Administered 2020-06-19: 200 mg via INTRAVENOUS
  Filled 2020-06-19: qty 200

## 2020-06-19 MED ORDER — SODIUM CHLORIDE 0.9 % IV SOLN: Freq: Once | INTRAVENOUS | Status: AC

## 2020-06-19 NOTE — Progress Notes (Signed)
Iron infusion given per orders. Patient tolerated it well without problems. Vitals stable and discharged home from clinic ambulatory. Follow up as scheduled.  

## 2020-06-19 NOTE — Patient Instructions (Signed)
[facility] at [facility]  Discharge Instructions:   _______________________________________________________________  Thank you for choosing [facility] at [facility] to provide your oncology and hematology care.  To afford each patient quality time with our providers, please arrive at least 15 minutes before your scheduled appointment.  You need to re-schedule your appointment if you arrive 10 or more minutes late.  We strive to give you quality time with our providers, and arriving late affects you and other patients whose appointments are after yours.  Also, if you no show three or more times for appointments you may be dismissed from the clinic.  Again, thank you for choosing [facility]. Our hope is that these requests will allow you access to exceptional care and in a timely manner. _______________________________________________________________  If you have questions after your visit, please contact our office at [phone] between the hours of 8:30 a.m. and 5:00 p.m. Voicemails left after 4:30 p.m. will not be returned until the following business day. _______________________________________________________________  For prescription refill requests, have your pharmacy contact our office. _______________________________________________________________  Recommendations made by the consultant and any test results will be sent to your referring physician. _______________________________________________________________ 

## 2020-06-24 ENCOUNTER — Inpatient Hospital Stay (HOSPITAL_COMMUNITY): Payer: Commercial Managed Care - PPO

## 2020-06-24 ENCOUNTER — Other Ambulatory Visit: Payer: Self-pay

## 2020-06-24 ENCOUNTER — Encounter (HOSPITAL_COMMUNITY): Payer: Self-pay

## 2020-06-24 VITALS — BP 143/87 | HR 66 | Temp 97.9°F | Resp 18

## 2020-06-24 DIAGNOSIS — D500 Iron deficiency anemia secondary to blood loss (chronic): Secondary | ICD-10-CM

## 2020-06-24 DIAGNOSIS — D509 Iron deficiency anemia, unspecified: Secondary | ICD-10-CM | POA: Diagnosis not present

## 2020-06-24 MED ORDER — SODIUM CHLORIDE 0.9 % IV SOLN: Freq: Once | INTRAVENOUS | Status: AC

## 2020-06-24 MED ORDER — SODIUM CHLORIDE 0.9 % IV SOLN
200.0000 mg | Freq: Once | INTRAVENOUS | Status: AC
  Administered 2020-06-24: 200 mg via INTRAVENOUS
  Filled 2020-06-24: qty 200

## 2020-06-24 NOTE — Progress Notes (Signed)
Patient tolerated iron infusion with no complaints voiced.  Peripheral IV site clean and dry with good blood return noted before and after infusion.  Band aid applied.  VSS with discharge and left in satisfactory condition with no s/s of distress noted.   

## 2020-06-28 ENCOUNTER — Ambulatory Visit (HOSPITAL_COMMUNITY): Payer: Commercial Managed Care - PPO

## 2020-07-03 ENCOUNTER — Other Ambulatory Visit: Payer: Self-pay

## 2020-07-03 ENCOUNTER — Encounter (HOSPITAL_COMMUNITY): Payer: Self-pay

## 2020-07-03 ENCOUNTER — Inpatient Hospital Stay (HOSPITAL_COMMUNITY): Payer: Commercial Managed Care - PPO

## 2020-07-03 VITALS — BP 151/88 | HR 66 | Temp 97.8°F | Resp 16

## 2020-07-03 DIAGNOSIS — D500 Iron deficiency anemia secondary to blood loss (chronic): Secondary | ICD-10-CM

## 2020-07-03 DIAGNOSIS — D509 Iron deficiency anemia, unspecified: Secondary | ICD-10-CM | POA: Diagnosis not present

## 2020-07-03 MED ORDER — SODIUM CHLORIDE 0.9 % IV SOLN
200.0000 mg | Freq: Once | INTRAVENOUS | Status: AC
  Administered 2020-07-03: 200 mg via INTRAVENOUS
  Filled 2020-07-03: qty 200

## 2020-07-03 MED ORDER — SODIUM CHLORIDE 0.9 % IV SOLN: Freq: Once | INTRAVENOUS | Status: AC

## 2020-07-03 NOTE — Progress Notes (Signed)
Patient tolerated iron infusion with no complaints voiced.  Peripheral IV site clean and dry with good blood return noted before and after infusion.  Band aid applied.  VSS with discharge and left in satisfactory condition with no s/s of distress noted.   

## 2020-07-03 NOTE — Patient Instructions (Signed)
Ferumoxytol injection What is this medicine? FERUMOXYTOL is an iron complex. Iron is used to make healthy red blood cells, which carry oxygen and nutrients throughout the body. This medicine is used to treat iron deficiency anemia. This medicine may be used for other purposes; ask your health care provider or pharmacist if you have questions. COMMON BRAND NAME(S): Feraheme What should I tell my health care provider before I take this medicine? They need to know if you have any of these conditions:  anemia not caused by low iron levels  high levels of iron in the blood  magnetic resonance imaging (MRI) test scheduled  an unusual or allergic reaction to iron, other medicines, foods, dyes, or preservatives  pregnant or trying to get pregnant  breast-feeding How should I use this medicine? This medicine is for injection into a vein. It is given by a health care professional in a hospital or clinic setting. Talk to your pediatrician regarding the use of this medicine in children. Special care may be needed. Overdosage: If you think you have taken too much of this medicine contact a poison control center or emergency room at once. NOTE: This medicine is only for you. Do not share this medicine with others. What if I miss a dose? It is important not to miss your dose. Call your doctor or health care professional if you are unable to keep an appointment. What may interact with this medicine? This medicine may interact with the following medications:  other iron products This list may not describe all possible interactions. Give your health care provider a list of all the medicines, herbs, non-prescription drugs, or dietary supplements you use. Also tell them if you smoke, drink alcohol, or use illegal drugs. Some items may interact with your medicine. What should I watch for while using this medicine? Visit your doctor or healthcare professional regularly. Tell your doctor or healthcare  professional if your symptoms do not start to get better or if they get worse. You may need blood work done while you are taking this medicine. You may need to follow a special diet. Talk to your doctor. Foods that contain iron include: whole grains/cereals, dried fruits, beans, or peas, leafy green vegetables, and organ meats (liver, kidney). What side effects may I notice from receiving this medicine? Side effects that you should report to your doctor or health care professional as soon as possible:  allergic reactions like skin rash, itching or hives, swelling of the face, lips, or tongue  breathing problems  changes in blood pressure  feeling faint or lightheaded, falls  fever or chills  flushing, sweating, or hot feelings  swelling of the ankles or feet Side effects that usually do not require medical attention (report to your doctor or health care professional if they continue or are bothersome):  diarrhea  headache  nausea, vomiting  stomach pain This list may not describe all possible side effects. Call your doctor for medical advice about side effects. You may report side effects to FDA at 1-800-FDA-1088. Where should I keep my medicine? This drug is given in a hospital or clinic and will not be stored at home. NOTE: This sheet is a summary. It may not cover all possible information. If you have questions about this medicine, talk to your doctor, pharmacist, or health care provider.  2020 Elsevier/Gold Standard (2016-12-21 20:21:10)   [facility] at [facility]  Discharge Instructions: call [phone] for any problems as directed.  _______________________________________________________________  Thank  you for choosing [facility] at [facility] to provide your oncology and hematology care.  To afford each patient quality time with our providers, please arrive at least 15 minutes before your scheduled appointment.  You need to  re-schedule your appointment if you arrive 10 or more minutes late.  We strive to give you quality time with our providers, and arriving late affects you and other patients whose appointments are after yours.  Also, if you no show three or more times for appointments you may be dismissed from the clinic.  Again, thank you for choosing [facility] at [facility]. Our hope is that these requests will allow you access to exceptional care and in a timely manner. _______________________________________________________________  If you have questions after your visit, please contact our office at [phone] between the hours of 8:30 a.m. and 5:00 p.m. Voicemails left after 4:30 p.m. will not be returned until the following business day. _______________________________________________________________  For prescription refill requests, have your pharmacy contact our office. _______________________________________________________________  Recommendations made by the consultant and any test results will be sent to your referring physician. _______________________________________________________________

## 2020-09-02 ENCOUNTER — Inpatient Hospital Stay (HOSPITAL_COMMUNITY): Payer: Commercial Managed Care - PPO | Attending: Hematology

## 2020-09-02 ENCOUNTER — Other Ambulatory Visit: Payer: Self-pay

## 2020-09-02 DIAGNOSIS — E559 Vitamin D deficiency, unspecified: Secondary | ICD-10-CM | POA: Diagnosis not present

## 2020-09-02 DIAGNOSIS — E538 Deficiency of other specified B group vitamins: Secondary | ICD-10-CM | POA: Diagnosis present

## 2020-09-02 DIAGNOSIS — D500 Iron deficiency anemia secondary to blood loss (chronic): Secondary | ICD-10-CM

## 2020-09-02 LAB — CBC WITH DIFFERENTIAL/PLATELET
Abs Immature Granulocytes: 0 10*3/uL (ref 0.00–0.07)
Basophils Absolute: 0 10*3/uL (ref 0.0–0.1)
Basophils Relative: 1 %
Eosinophils Absolute: 0.2 10*3/uL (ref 0.0–0.5)
Eosinophils Relative: 4 %
HCT: 42.8 % (ref 39.0–52.0)
Hemoglobin: 13.2 g/dL (ref 13.0–17.0)
Immature Granulocytes: 0 %
Lymphocytes Relative: 28 %
Lymphs Abs: 1.2 10*3/uL (ref 0.7–4.0)
MCH: 26.7 pg (ref 26.0–34.0)
MCHC: 30.8 g/dL (ref 30.0–36.0)
MCV: 86.6 fL (ref 80.0–100.0)
Monocytes Absolute: 0.3 10*3/uL (ref 0.1–1.0)
Monocytes Relative: 8 %
Neutro Abs: 2.6 10*3/uL (ref 1.7–7.7)
Neutrophils Relative %: 59 %
Platelets: 278 10*3/uL (ref 150–400)
RBC: 4.94 MIL/uL (ref 4.22–5.81)
RDW: 15.3 % (ref 11.5–15.5)
WBC: 4.3 10*3/uL (ref 4.0–10.5)
nRBC: 0 % (ref 0.0–0.2)

## 2020-09-02 LAB — COMPREHENSIVE METABOLIC PANEL
ALT: 23 U/L (ref 0–44)
AST: 20 U/L (ref 15–41)
Albumin: 4.2 g/dL (ref 3.5–5.0)
Alkaline Phosphatase: 58 U/L (ref 38–126)
Anion gap: 8 (ref 5–15)
BUN: 15 mg/dL (ref 6–20)
CO2: 30 mmol/L (ref 22–32)
Calcium: 9.1 mg/dL (ref 8.9–10.3)
Chloride: 103 mmol/L (ref 98–111)
Creatinine, Ser: 0.9 mg/dL (ref 0.61–1.24)
GFR, Estimated: >60 mL/min (ref >60)
Glucose, Bld: 104 mg/dL — ABNORMAL HIGH (ref 70–99)
Potassium: 4.3 mmol/L (ref 3.5–5.1)
Sodium: 141 mmol/L (ref 135–145)
Total Bilirubin: 1.1 mg/dL (ref 0.3–1.2)
Total Protein: 7.4 g/dL (ref 6.5–8.1)

## 2020-09-02 LAB — VITAMIN D 25 HYDROXY (VIT D DEFICIENCY, FRACTURES): Vit D, 25-Hydroxy: 18.22 ng/mL — ABNORMAL LOW (ref 30–100)

## 2020-09-02 LAB — VITAMIN B12: Vitamin B-12: 350 pg/mL (ref 180–914)

## 2020-09-02 LAB — IRON AND TIBC
Iron: 77 ug/dL (ref 45–182)
Saturation Ratios: 14 % — ABNORMAL LOW (ref 17.9–39.5)
TIBC: 562 ug/dL — ABNORMAL HIGH (ref 250–450)
UIBC: 485 ug/dL

## 2020-09-02 LAB — FERRITIN: Ferritin: 9 ng/mL — ABNORMAL LOW (ref 24–336)

## 2020-09-09 ENCOUNTER — Ambulatory Visit (HOSPITAL_COMMUNITY): Payer: Commercial Managed Care - PPO | Admitting: Hematology

## 2020-10-28 ENCOUNTER — Encounter (HOSPITAL_COMMUNITY): Payer: Self-pay

## 2020-12-10 ENCOUNTER — Other Ambulatory Visit (HOSPITAL_COMMUNITY): Payer: Self-pay

## 2020-12-10 DIAGNOSIS — D500 Iron deficiency anemia secondary to blood loss (chronic): Secondary | ICD-10-CM

## 2020-12-11 ENCOUNTER — Other Ambulatory Visit: Payer: Self-pay

## 2020-12-11 ENCOUNTER — Inpatient Hospital Stay (HOSPITAL_COMMUNITY): Payer: Commercial Managed Care - PPO | Attending: Hematology

## 2020-12-11 DIAGNOSIS — Z8719 Personal history of other diseases of the digestive system: Secondary | ICD-10-CM | POA: Diagnosis not present

## 2020-12-11 DIAGNOSIS — Z79899 Other long term (current) drug therapy: Secondary | ICD-10-CM | POA: Diagnosis not present

## 2020-12-11 DIAGNOSIS — D509 Iron deficiency anemia, unspecified: Secondary | ICD-10-CM | POA: Diagnosis present

## 2020-12-11 DIAGNOSIS — I10 Essential (primary) hypertension: Secondary | ICD-10-CM | POA: Insufficient documentation

## 2020-12-11 DIAGNOSIS — E559 Vitamin D deficiency, unspecified: Secondary | ICD-10-CM | POA: Insufficient documentation

## 2020-12-11 DIAGNOSIS — E538 Deficiency of other specified B group vitamins: Secondary | ICD-10-CM | POA: Diagnosis not present

## 2020-12-11 DIAGNOSIS — Z8249 Family history of ischemic heart disease and other diseases of the circulatory system: Secondary | ICD-10-CM | POA: Insufficient documentation

## 2020-12-11 DIAGNOSIS — D500 Iron deficiency anemia secondary to blood loss (chronic): Secondary | ICD-10-CM

## 2020-12-11 DIAGNOSIS — K219 Gastro-esophageal reflux disease without esophagitis: Secondary | ICD-10-CM | POA: Diagnosis not present

## 2020-12-11 LAB — CBC WITH DIFFERENTIAL/PLATELET
Abs Immature Granulocytes: 0.01 10*3/uL (ref 0.00–0.07)
Basophils Absolute: 0 10*3/uL (ref 0.0–0.1)
Basophils Relative: 1 %
Eosinophils Absolute: 0.1 10*3/uL (ref 0.0–0.5)
Eosinophils Relative: 3 %
HCT: 36.5 % — ABNORMAL LOW (ref 39.0–52.0)
Hemoglobin: 10.4 g/dL — ABNORMAL LOW (ref 13.0–17.0)
Immature Granulocytes: 0 %
Lymphocytes Relative: 29 %
Lymphs Abs: 1.4 10*3/uL (ref 0.7–4.0)
MCH: 23 pg — ABNORMAL LOW (ref 26.0–34.0)
MCHC: 28.5 g/dL — ABNORMAL LOW (ref 30.0–36.0)
MCV: 80.8 fL (ref 80.0–100.0)
Monocytes Absolute: 0.3 10*3/uL (ref 0.1–1.0)
Monocytes Relative: 6 %
Neutro Abs: 3 10*3/uL (ref 1.7–7.7)
Neutrophils Relative %: 61 %
Platelets: 270 10*3/uL (ref 150–400)
RBC: 4.52 MIL/uL (ref 4.22–5.81)
RDW: 15.2 % (ref 11.5–15.5)
WBC: 4.8 10*3/uL (ref 4.0–10.5)
nRBC: 0 % (ref 0.0–0.2)

## 2020-12-11 LAB — IRON AND TIBC
Iron: 25 ug/dL — ABNORMAL LOW (ref 45–182)
Saturation Ratios: 4 % — ABNORMAL LOW (ref 17.9–39.5)
TIBC: 620 ug/dL — ABNORMAL HIGH (ref 250–450)
UIBC: 595 ug/dL

## 2020-12-11 LAB — FERRITIN: Ferritin: 5 ng/mL — ABNORMAL LOW (ref 24–336)

## 2020-12-11 LAB — VITAMIN B12: Vitamin B-12: 273 pg/mL (ref 180–914)

## 2020-12-12 ENCOUNTER — Other Ambulatory Visit: Payer: Self-pay

## 2020-12-12 ENCOUNTER — Telehealth (HOSPITAL_COMMUNITY): Payer: Commercial Managed Care - PPO | Admitting: Oncology

## 2020-12-12 ENCOUNTER — Inpatient Hospital Stay (HOSPITAL_BASED_OUTPATIENT_CLINIC_OR_DEPARTMENT_OTHER): Payer: Commercial Managed Care - PPO | Admitting: Oncology

## 2020-12-12 DIAGNOSIS — D500 Iron deficiency anemia secondary to blood loss (chronic): Secondary | ICD-10-CM | POA: Diagnosis not present

## 2020-12-12 NOTE — Progress Notes (Signed)
[facility] [address], [place] [address]   CLINIC:  Medical Oncology/Hematology  PCP:  Gareth Morgan, MD [address] [place] / [place] [address]  [phone]  REASON FOR VISIT:  Follow-up for iron deficiency, B12 deficiency, and vitamin D deficiency  I connected with Troy [name] on 12/12/20 at 12:15 PM EST by telephone visit and verified that I am speaking with the correct person using two identifiers.   I discussed the limitations, risks, security and privacy concerns of performing an evaluation and management service by telemedicine and the availability of in-person appointments. I also discussed with the patient that there may be a patient responsible charge related to this service. The patient expressed understanding and agreed to proceed.   Other persons participating in the visit and their role in the encounter: None  Patient's location: Home  Provider's location: Clinic    PRIOR THERAPY: None  CURRENT THERAPY: Intermittent Venofer infusions  INTERVAL HISTORY:  Troy [name], a 60 y.o. male, returns for routine follow-up for his iron deficiency, B12 deficiency and vitamin D deficiency. Troy [name] was last seen on 06/05/2020.  He last received iron infusion on 06/19/2020, 06/24/2020 and 07/03/2020.  In interim, he denies any new complaints.  He does note his energy level has dropped dramatically over the past few weeks.  He called in for earlier labs to be drawn.  He works 12-hour shifts and feels fatigued after his shifts.  He denies any melena, hematochezia or hematuria.  He is not currently taking oral iron.   REVIEW OF SYSTEMS:  Review of Systems  Constitutional: Positive for fatigue. Negative for appetite change, chills and fever.  HENT:  Negative.  Negative for hearing loss, lump/mass, mouth sores and nosebleeds.   Eyes: Negative.  Negative for eye problems.  Respiratory: Negative for cough, hemoptysis and shortness of breath.    Cardiovascular: Negative.  Negative for chest pain and leg swelling.  Gastrointestinal: Negative.  Negative for abdominal pain, blood in stool, constipation, diarrhea, nausea and vomiting.  Endocrine: Negative.  Negative for hot flashes.  Genitourinary: Negative.  Negative for bladder incontinence, difficulty urinating, dysuria, frequency and hematuria.   Musculoskeletal: Negative.  Negative for back pain, flank pain, gait problem and myalgias.  Skin: Negative.  Negative for itching and rash.  Neurological: Negative.  Negative for dizziness, gait problem, headaches, light-headedness and numbness.  Hematological: Negative.  Negative for adenopathy.  Psychiatric/Behavioral: Negative for confusion. The patient is not nervous/anxious.   All other systems reviewed and are negative.   PAST MEDICAL/SURGICAL HISTORY:  Past Medical History:  Diagnosis Date  . Alcohol dependence (HCC)   . Anxiety   . Chest pain   . GERD (gastroesophageal reflux disease)   . Hypertension   . Insomnia   . Iron deficiency anemia 11/25/2016  . Plantar fascial fibromatosis   . Psoriasis    Past Surgical History:  Procedure Laterality Date  . BIOPSY  12/01/2016   Procedure: BIOPSY;  Surgeon: West Bali, MD;  [facility]: [facility];  Service: Endoscopy;;  gastric duodenal hiatal hernia pouch  . COLONOSCOPY WITH PROPOFOL N/A 12/01/2016   Procedure: COLONOSCOPY WITH PROPOFOL;  Surgeon: West Bali, MD;  Location: [facility];  Service: Endoscopy;  Laterality: N/A;  1:45 pm  . ESOPHAGOGASTRODUODENOSCOPY  2004   Dr. 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 ESOPHAGOGASTRODUODENOSCOPY (EGD) WITH PROPOFOL N/A 12/01/2016   Procedure: ESOPHAGOGASTRODUODENOSCOPY (EGD) WITH PROPOFOL;  Surgeon:  West Bali, MD;  [facility]: [facility];  Service: Endoscopy;  Laterality: N/A;  . GIVENS CAPSULE STUDY N/A 12/23/2016   Procedure: GIVENS CAPSULE STUDY;  Surgeon: West Bali, MD;  Location: [facility];  Service: Endoscopy;  Laterality: N/A;  arrive at 7:00am for 7:30am appt time  . HEMORRHOID SURGERY     residual skin tags  . right ankle    . UMBILICAL HERNIA REPAIR N/A 06/07/2014   Procedure: HERNIA REPAIR UMBILICAL ADULT;  Surgeon: Marlane Hatcher, MD;  Location: [facility] ORS;  Service: General;  Laterality: N/A;    SOCIAL HISTORY:  Social History   Socioeconomic History  . Marital status: Married    Spouse name: Not on file  . Number of children: Not on file  . Years of education: Not on file  . Highest education level: Not on file  Occupational History  . Not on file  Tobacco Use  . Smoking status: Never Smoker  . Smokeless tobacco: Never Used  Vaping Use  . Vaping Use: Never used  Substance and Sexual Activity  . Alcohol use: Yes    Comment: 6-12 beers at a time sometimes daily.   . Drug use: No  . Sexual activity: Yes  Other Topics Concern  . Not on file  Social History Narrative  . Not on file   Social Determinants of Health   Financial Resource Strain: Not on file  Food Insecurity: Not on file  Transportation Needs: Not on file  Physical Activity: Not on file  Stress: Not on file  Social Connections: Not on file  Intimate Partner Violence: Not on file    FAMILY HISTORY:  Family History  Problem Relation Age of Onset  . Heart disease Mother        stent placement  . Heart attack Father        died from MR age 25  . Heart attack Maternal Grandmother   . Colon cancer Paternal Grandfather     CURRENT MEDICATIONS:  Current Outpatient Medications  Medication Sig Dispense Refill  . Cyanocobalamin (B-12) 5000 MCG SUBL Place 5,000 mcg under the tongue daily. 30 tablet 6  . lisinopril (PRINIVIL,ZESTRIL) 20 MG tablet Take 10 mg by mouth daily.     Marland Kitchen omeprazole (PRILOSEC) 20 MG capsule 1 PO 30 mins prior to breakfast 30 capsule 11  . Polysacch Fe Cmp-Fe Heme Poly (BIFERA) 28 MG TABS 1 po bid 60 tablet 11   No current facility-administered  medications for this visit.    ALLERGIES:  No Known Allergies  PHYSICAL EXAM:  Performance status (ECOG): 1 - Symptomatic but completely ambulatory  There were no vitals filed for this visit. Wt Readings from Last 3 Encounters:  06/05/20 207 lb 12.8 oz (94.3 kg)  03/04/20 210 lb (95.3 kg)  12/04/19 215 lb (97.5 kg)   Limited secondary to virtual visit. Speaking in full complete sentences.  LABORATORY DATA:  I have reviewed the labs as listed.  CBC Latest Ref Rng & Units 12/11/2020 09/02/2020 05/29/2020  WBC 4.0 - 10.5 K/uL 4.8 4.3 5.1  Hemoglobin 13.0 - 17.0 g/dL 10.4(L) 13.2 10.9(L)  Hematocrit 39.0 - 52.0 % 36.5(L) 42.8 36.2(L)  Platelets 150 - 400 K/uL 270 278 310   CMP Latest Ref Rng & Units 09/02/2020 05/29/2020 02/26/2020  Glucose 70 - 99 mg/dL 734(K) 876(O) 115(B)  BUN 6 - 20 mg/dL 15 16 20   Creatinine 0.61 - 1.24 mg/dL 2.62 0.35  Sodium 135 - 145  mmol/L 141 142 140  Potassium 3.5 - 5.1 mmol/L 4.3 4.5 4.7  Chloride 98 - 111 mmol/L 103 110 104  CO2 22 - 32 mmol/L 30 25 27   Calcium 8.9 - 10.3 mg/dL 9.1 9.2 9.1  Total Protein 6.5 - 8.1 g/dL 7.4 7.8 7.6  Total Bilirubin 0.3 - 1.2 mg/dL 1.1 0.5 0.9  Alkaline Phos 38 - 126 U/L 58 64 79  AST 15 - 41 U/L 20 17 20   ALT 0 - 44 U/L 23 20 23       Component Value Date/Time   RBC 4.52 12/11/2020 1028   MCV 80.8 12/11/2020 1028   MCH 23.0 (L) 12/11/2020 1028   MCHC 28.5 (L) 12/11/2020 1028   RDW 15.2 12/11/2020 1028   LYMPHSABS 1.4 12/11/2020 1028   MONOABS 0.3 12/11/2020 1028   EOSABS 0.1 12/11/2020 1028   BASOSABS 0.0 12/11/2020 1028    DIAGNOSTIC IMAGING:  I have independently reviewed the scans and discussed with the patient.   ASSESSMENT:  1.  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 -EGD/colonoscopy on 12/01/2016 showed AVMs and Cameron erosions.  Capsule study on 12/23/2016. -CTAP on 12/28/2016 shows normal spleen. -He has been receiving intermittent  Feraheme infusions.   PLAN:  1.  Iron deficiency anemia: -Reported feeling weak recently particularly over the past 1 to 2 weeks. -Labs from 12/11/2020 showed a ferritin of 5, saturation 4% and iron of 25.  Hemoglobin is 10.4 (13.2).  -I have recommended Venofer X 4 infusions.   -I have also asked for him to trial oral iron tablets or liquid.   -He states he has never tried oral tablets because he was told he would not absorb them. -We will plan to see him back in 3 months with repeat labs.  2.  Vitamin B12 deficiency: -He will continue B12 1 mg tablet daily.   -B12 level today is 273.   3.  Vitamin D deficiency: -Vitamin D is 22.8.  He was told to take vitamin D 2000 units daily.  Check vitamin D in 3 months.  Disposition: -Iron infusions X 4 doses -RTC in 3 months with repeat labs (CBC, CMP, vitamin D, vitamin B12) and MD assessment.  Orders placed this encounter:  No orders of the defined types were placed in this encounter.  02/20/2017, NP 12/12/2020 3:33 PM  12/13/2020 [facility] Center [phone]  I provided 15 minutes of non face-to-face telephone visit time during this encounter, and > 50% was spent counseling as documented under my assessment & plan.

## 2020-12-19 ENCOUNTER — Other Ambulatory Visit: Payer: Self-pay

## 2020-12-19 ENCOUNTER — Inpatient Hospital Stay (HOSPITAL_COMMUNITY): Payer: Commercial Managed Care - PPO | Attending: Hematology

## 2020-12-19 VITALS — BP 129/77 | HR 67 | Temp 97.3°F | Resp 18

## 2020-12-19 DIAGNOSIS — D509 Iron deficiency anemia, unspecified: Secondary | ICD-10-CM | POA: Insufficient documentation

## 2020-12-19 DIAGNOSIS — D500 Iron deficiency anemia secondary to blood loss (chronic): Secondary | ICD-10-CM

## 2020-12-19 MED ORDER — SODIUM CHLORIDE 0.9 % IV SOLN: Freq: Once | INTRAVENOUS | Status: AC

## 2020-12-19 MED ORDER — SODIUM CHLORIDE 0.9 % IV SOLN
200.0000 mg | Freq: Once | INTRAVENOUS | Status: AC
  Administered 2020-12-19: 200 mg via INTRAVENOUS
  Filled 2020-12-19: qty 200

## 2020-12-19 NOTE — Progress Notes (Signed)
Patient presents today for Venofer infusion.  Vital signs WNL.  No complaints at this time.  Treatment given today per MD orders.  Tolerated infusion without adverse affects.  Vital signs stable.  No complaints at this time.  Discharge from clinic ambulatory in stable condition.  Alert and oriented X 3.  Follow up with [facility] as scheduled. 

## 2020-12-19 NOTE — Patient Instructions (Signed)
[facility] Discharge Instructions for Patients  Today you received the following.   To help prevent nausea and vomiting after your treatment, we encourage you to take your nausea medication If you develop nausea and vomiting that is not controlled by your nausea medication, call the clinic.   BELOW ARE SYMPTOMS THAT SHOULD BE REPORTED IMMEDIATELY:  *FEVER GREATER THAN 100.5 F  *CHILLS WITH OR WITHOUT FEVER  NAUSEA AND VOMITING THAT IS NOT CONTROLLED WITH YOUR NAUSEA MEDICATION  *UNUSUAL SHORTNESS OF BREATH  *UNUSUAL BRUISING OR BLEEDING  TENDERNESS IN MOUTH AND THROAT WITH OR WITHOUT PRESENCE OF ULCERS  *URINARY PROBLEMS  *BOWEL PROBLEMS  UNUSUAL RASH Items with * indicate a potential emergency and should be followed up as soon as possible.  Feel free to call the clinic should you have any questions or concerns. The clinic phone number is [phone].  Please show the CHEMO ALERT CARD at check-in to the Emergency Department and triage nurse.   

## 2020-12-23 ENCOUNTER — Ambulatory Visit (HOSPITAL_COMMUNITY): Payer: Commercial Managed Care - PPO

## 2020-12-24 ENCOUNTER — Other Ambulatory Visit: Payer: Self-pay

## 2020-12-24 ENCOUNTER — Inpatient Hospital Stay (HOSPITAL_COMMUNITY): Payer: Commercial Managed Care - PPO

## 2020-12-24 VITALS — BP 144/91 | HR 77 | Temp 97.3°F | Resp 18

## 2020-12-24 DIAGNOSIS — D509 Iron deficiency anemia, unspecified: Secondary | ICD-10-CM | POA: Diagnosis not present

## 2020-12-24 DIAGNOSIS — D500 Iron deficiency anemia secondary to blood loss (chronic): Secondary | ICD-10-CM

## 2020-12-24 MED ORDER — SODIUM CHLORIDE 0.9 % IV SOLN
200.0000 mg | Freq: Once | INTRAVENOUS | Status: AC
  Administered 2020-12-24: 200 mg via INTRAVENOUS
  Filled 2020-12-24: qty 200

## 2020-12-24 MED ORDER — SODIUM CHLORIDE 0.9 % IV SOLN: Freq: Once | INTRAVENOUS | Status: AC

## 2020-12-24 NOTE — Patient Instructions (Signed)
[facility] at [facility]  Discharge Instructions:   _______________________________________________________________  Thank you for choosing [facility] at [facility] to provide your oncology and hematology care.  To afford each patient quality time with our providers, please arrive at least 15 minutes before your scheduled appointment.  You need to re-schedule your appointment if you arrive 10 or more minutes late.  We strive to give you quality time with our providers, and arriving late affects you and other patients whose appointments are after yours.  Also, if you no show three or more times for appointments you may be dismissed from the clinic.  Again, thank you for choosing [facility] at [facility]. Our hope is that these requests will allow you access to exceptional care and in a timely manner. _______________________________________________________________  If you have questions after your visit, please contact our office at [phone] between the hours of 8:30 a.m. and 5:00 p.m. Voicemails left after 4:30 p.m. will not be returned until the following business day. _______________________________________________________________  For prescription refill requests, have your pharmacy contact our office. _______________________________________________________________  Recommendations made by the consultant and any test results will be sent to your referring physician. _______________________________________________________________ 

## 2020-12-24 NOTE — Progress Notes (Signed)
Patient presents today for Venofer infusion. MAR reviewed and updated. Patient has no complaints of any pain or changes since his last visit.   Venofer given today per MD orders. Tolerated infusion without adverse affects. Vital signs stable. No complaints at this time. Discharged from clinic ambulatory in stable condition. Alert and oriented x 3. F/U with [facility] as scheduled.

## 2020-12-27 ENCOUNTER — Inpatient Hospital Stay (HOSPITAL_COMMUNITY): Payer: Commercial Managed Care - PPO

## 2020-12-27 ENCOUNTER — Other Ambulatory Visit: Payer: Self-pay

## 2020-12-27 VITALS — BP 200/92 | HR 63 | Temp 97.0°F | Resp 18

## 2020-12-27 DIAGNOSIS — D500 Iron deficiency anemia secondary to blood loss (chronic): Secondary | ICD-10-CM

## 2020-12-27 DIAGNOSIS — D509 Iron deficiency anemia, unspecified: Secondary | ICD-10-CM | POA: Diagnosis not present

## 2020-12-27 MED ORDER — SODIUM CHLORIDE 0.9 % IV SOLN: Freq: Once | INTRAVENOUS | Status: AC

## 2020-12-27 MED ORDER — SODIUM CHLORIDE 0.9 % IV SOLN
200.0000 mg | Freq: Once | INTRAVENOUS | Status: AC
  Administered 2020-12-27: 200 mg via INTRAVENOUS
  Filled 2020-12-27: qty 200

## 2020-12-27 NOTE — Patient Instructions (Signed)
[facility] at [facility]  Discharge Instructions:   _______________________________________________________________  Thank you for choosing [facility] at [facility] to provide your oncology and hematology care.  To afford each patient quality time with our providers, please arrive at least 15 minutes before your scheduled appointment.  You need to re-schedule your appointment if you arrive 10 or more minutes late.  We strive to give you quality time with our providers, and arriving late affects you and other patients whose appointments are after yours.  Also, if you no show three or more times for appointments you may be dismissed from the clinic.  Again, thank you for choosing [facility] at [facility]. Our hope is that these requests will allow you access to exceptional care and in a timely manner. _______________________________________________________________  If you have questions after your visit, please contact our office at [phone] between the hours of 8:30 a.m. and 5:00 p.m. Voicemails left after 4:30 p.m. will not be returned until the following business day. _______________________________________________________________  For prescription refill requests, have your pharmacy contact our office. _______________________________________________________________  Recommendations made by the consultant and any test results will be sent to your referring physician. _______________________________________________________________ 

## 2020-12-27 NOTE — Progress Notes (Signed)
Patient presents today for Venofer infusion. Patient's blood pressure elevated on arrival. Patient denies any headache, blurred vision or dizziness. MAR reviewed and updated. Patient states he last had Lisinopril 20 mg at bedtime. Patient teaching performed.    Reported blood pressures on discharge to Dr. Al Pimple. Verbal orders received to give 10 mg of IV Hydralazine x 1 dose now. Wait 30  Minutes and recheck blood pressure.   Patient declined blood pressure medication IV and PO. Reported to MD.   Venofer given today per MD orders. Tolerated infusion without adverse affects. Blood pressure elevated at discharge. Patient left against medical advice to stay and receive Hydralazine 10 mg x 1 dose now per Dr. Al Pimple verbal orders. Patient teaching performed. Patient declined blood pressure medication PO and IV at this time. Patient states, " I will go home, take a Lisinopril and recheck my blood pressure and call back to [facility]. No complaints at this time. Discharged from clinic ambulatory in stable condition. Alert and oriented x 3. F/U with [facility] as scheduled.

## 2021-01-01 ENCOUNTER — Inpatient Hospital Stay (HOSPITAL_COMMUNITY): Payer: Commercial Managed Care - PPO

## 2021-01-01 ENCOUNTER — Other Ambulatory Visit: Payer: Self-pay

## 2021-01-01 VITALS — BP 157/89 | HR 75 | Temp 97.5°F | Resp 18

## 2021-01-01 DIAGNOSIS — D509 Iron deficiency anemia, unspecified: Secondary | ICD-10-CM | POA: Diagnosis not present

## 2021-01-01 DIAGNOSIS — D500 Iron deficiency anemia secondary to blood loss (chronic): Secondary | ICD-10-CM

## 2021-01-01 MED ORDER — SODIUM CHLORIDE 0.9 % IV SOLN: Freq: Once | INTRAVENOUS | Status: AC

## 2021-01-01 MED ORDER — SODIUM CHLORIDE 0.9 % IV SOLN
200.0000 mg | Freq: Once | INTRAVENOUS | Status: AC
  Administered 2021-01-01: 200 mg via INTRAVENOUS
  Filled 2021-01-01: qty 200

## 2021-01-01 NOTE — Patient Instructions (Signed)
[facility] at [facility]  Discharge Instructions:   _______________________________________________________________  Thank you for choosing [facility] at [facility] to provide your oncology and hematology care.  To afford each patient quality time with our providers, please arrive at least 15 minutes before your scheduled appointment.  You need to re-schedule your appointment if you arrive 10 or more minutes late.  We strive to give you quality time with our providers, and arriving late affects you and other patients whose appointments are after yours.  Also, if you no show three or more times for appointments you may be dismissed from the clinic.  Again, thank you for choosing [facility] at [facility]. Our hope is that these requests will allow you access to exceptional care and in a timely manner. _______________________________________________________________  If you have questions after your visit, please contact our office at [phone] between the hours of 8:30 a.m. and 5:00 p.m. Voicemails left after 4:30 p.m. will not be returned until the following business day. _______________________________________________________________  For prescription refill requests, have your pharmacy contact our office. _______________________________________________________________  Recommendations made by the consultant and any test results will be sent to your referring physician. _______________________________________________________________ 

## 2021-01-01 NOTE — Progress Notes (Signed)
Tolerated iron infusion well without incidence.  Discharged in stable condition ambulatory.  Refused the 30 minute wait time per policy. Vital signs stable prior to discharge.

## 2021-01-08 ENCOUNTER — Other Ambulatory Visit (HOSPITAL_COMMUNITY): Payer: Self-pay

## 2021-01-08 DIAGNOSIS — D500 Iron deficiency anemia secondary to blood loss (chronic): Secondary | ICD-10-CM

## 2021-01-08 DIAGNOSIS — I159 Secondary hypertension, unspecified: Secondary | ICD-10-CM

## 2021-01-08 DIAGNOSIS — I10 Essential (primary) hypertension: Secondary | ICD-10-CM

## 2021-01-08 DIAGNOSIS — K2101 Gastro-esophageal reflux disease with esophagitis, with bleeding: Secondary | ICD-10-CM

## 2021-01-08 DIAGNOSIS — Z125 Encounter for screening for malignant neoplasm of prostate: Secondary | ICD-10-CM

## 2021-01-08 NOTE — Progress Notes (Signed)
This nurse received a request from [facility] to add TSH, PSA, Lipid Panel to next lab draw scheduled for 03/10/21 and fax results to office.  Orders have been placed with next scheduled labs.  No further questions or concerns at this time.

## 2021-03-10 ENCOUNTER — Inpatient Hospital Stay (HOSPITAL_COMMUNITY): Payer: Commercial Managed Care - PPO | Attending: Hematology

## 2021-03-10 ENCOUNTER — Other Ambulatory Visit: Payer: Self-pay

## 2021-03-10 DIAGNOSIS — I10 Essential (primary) hypertension: Secondary | ICD-10-CM

## 2021-03-10 DIAGNOSIS — D500 Iron deficiency anemia secondary to blood loss (chronic): Secondary | ICD-10-CM

## 2021-03-10 DIAGNOSIS — Z79899 Other long term (current) drug therapy: Secondary | ICD-10-CM | POA: Diagnosis not present

## 2021-03-10 DIAGNOSIS — D509 Iron deficiency anemia, unspecified: Secondary | ICD-10-CM | POA: Insufficient documentation

## 2021-03-10 DIAGNOSIS — K2101 Gastro-esophageal reflux disease with esophagitis, with bleeding: Secondary | ICD-10-CM

## 2021-03-10 DIAGNOSIS — Z125 Encounter for screening for malignant neoplasm of prostate: Secondary | ICD-10-CM

## 2021-03-10 LAB — CBC WITH DIFFERENTIAL/PLATELET
Abs Immature Granulocytes: 0.02 10*3/uL (ref 0.00–0.07)
Basophils Absolute: 0.1 10*3/uL (ref 0.0–0.1)
Basophils Relative: 1 %
Eosinophils Absolute: 0.2 10*3/uL (ref 0.0–0.5)
Eosinophils Relative: 3 %
HCT: 41.4 % (ref 39.0–52.0)
Hemoglobin: 12.5 g/dL — ABNORMAL LOW (ref 13.0–17.0)
Immature Granulocytes: 0 %
Lymphocytes Relative: 24 %
Lymphs Abs: 1.4 10*3/uL (ref 0.7–4.0)
MCH: 25.7 pg — ABNORMAL LOW (ref 26.0–34.0)
MCHC: 30.2 g/dL (ref 30.0–36.0)
MCV: 85.2 fL (ref 80.0–100.0)
Monocytes Absolute: 0.4 10*3/uL (ref 0.1–1.0)
Monocytes Relative: 7 %
Neutro Abs: 4 10*3/uL (ref 1.7–7.7)
Neutrophils Relative %: 65 %
Platelets: 345 10*3/uL (ref 150–400)
RBC: 4.86 MIL/uL (ref 4.22–5.81)
RDW: 17 % — ABNORMAL HIGH (ref 11.5–15.5)
WBC: 6.1 10*3/uL (ref 4.0–10.5)
nRBC: 0 % (ref 0.0–0.2)

## 2021-03-10 LAB — LIPID PANEL
Cholesterol: 190 mg/dL (ref 0–200)
HDL: 63 mg/dL (ref >40)
LDL Cholesterol: 106 mg/dL — ABNORMAL HIGH (ref 0–99)
Total CHOL/HDL Ratio: 3 RATIO
Triglycerides: 107 mg/dL (ref <150)
VLDL: 21 mg/dL (ref 0–40)

## 2021-03-10 LAB — VITAMIN B12: Vitamin B-12: 462 pg/mL (ref 180–914)

## 2021-03-10 LAB — COMPREHENSIVE METABOLIC PANEL
ALT: 25 U/L (ref 0–44)
AST: 20 U/L (ref 15–41)
Albumin: 4 g/dL (ref 3.5–5.0)
Alkaline Phosphatase: 70 U/L (ref 38–126)
Anion gap: 8 (ref 5–15)
BUN: 18 mg/dL (ref 6–20)
CO2: 25 mmol/L (ref 22–32)
Calcium: 8.8 mg/dL — ABNORMAL LOW (ref 8.9–10.3)
Chloride: 104 mmol/L (ref 98–111)
Creatinine, Ser: 0.98 mg/dL (ref 0.61–1.24)
GFR, Estimated: >60 mL/min (ref >60)
Glucose, Bld: 134 mg/dL — ABNORMAL HIGH (ref 70–99)
Potassium: 4.1 mmol/L (ref 3.5–5.1)
Sodium: 137 mmol/L (ref 135–145)
Total Bilirubin: 0.7 mg/dL (ref 0.3–1.2)
Total Protein: 7.4 g/dL (ref 6.5–8.1)

## 2021-03-10 LAB — VITAMIN D 25 HYDROXY (VIT D DEFICIENCY, FRACTURES): Vit D, 25-Hydroxy: 13.41 ng/mL — ABNORMAL LOW (ref 30–100)

## 2021-03-10 LAB — PSA: Prostatic Specific Antigen: 0.99 ng/mL (ref 0.00–4.00)

## 2021-03-10 LAB — TSH: TSH: 1.26 u[IU]/mL (ref 0.350–4.500)

## 2021-03-17 ENCOUNTER — Encounter (HOSPITAL_COMMUNITY): Payer: Self-pay | Admitting: Hematology

## 2021-03-17 ENCOUNTER — Inpatient Hospital Stay (HOSPITAL_COMMUNITY): Payer: Commercial Managed Care - PPO | Attending: Hematology | Admitting: Hematology

## 2021-03-17 ENCOUNTER — Other Ambulatory Visit: Payer: Self-pay

## 2021-03-17 DIAGNOSIS — E559 Vitamin D deficiency, unspecified: Secondary | ICD-10-CM

## 2021-03-17 DIAGNOSIS — D500 Iron deficiency anemia secondary to blood loss (chronic): Secondary | ICD-10-CM | POA: Diagnosis not present

## 2021-03-17 MED ORDER — ERGOCALCIFEROL 1.25 MG (50000 UT) PO CAPS: 50000.0000 [IU] | ORAL_CAPSULE | ORAL | 3 refills | Status: DC

## 2021-03-17 NOTE — Progress Notes (Signed)
Virtual Visit via Telephone Note  I connected with [name] on 03/17/21 at  4:00 PM EDT by telephone and verified that I am speaking with the correct person using two identifiers.  Location: Patient: At home Provider: In the office   I discussed the limitations, risks, security and privacy concerns of performing an evaluation and management service by telephone and the availability of in person appointments. I also discussed with the patient that there may be a patient responsible charge related to this service. The patient expressed understanding and agreed to proceed.   History of Present Illness: This patient is follow-up in our clinic for iron deficiency anemia.  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  Last EGD and colonoscopy was on 12/01/2016 showed AVMs and Cameron erosions.   Observations/Objective: He has received 4 infusions of Venofer from 12/19/2020 through 01/01/2021.  He reported slight delay in improvement of his energy levels after the last set of infusions.  Denies any bleeding per rectum or melena.  Reports that he has never taken iron pills.  He is taking over-the-counter calcium and vitamin D supplements.  Assessment and Plan:  1.  Iron deficiency anemia: - 4 infusions of Venofer 200 mg from 12/19/2020 through 01/01/2021. - Reviewed labs from 03/10/2021.  Hemoglobin improved to 12.5.  Unfortunately ferritin and iron panel was not done. - I have recommended him to start taking iron tablet daily along with stool softener if needed. - We will repeat his iron panel and CBC in 3 months.  He will call us back if he develops any symptoms of severe iron deficiency.  2.  Vitamin D deficiency: - His vitamin D level is low at 13.41. - He is taking over-the-counter vitamin D. - I have recommended vitamin D 50,000 units weekly.  I have sent in the prescription.  I plan to repeat levels in 3 months.  3.  Vitamin B12 deficiency: -  Continue vitamin B12 1 mg tablet daily.  B12 level was 462.   Follow Up Instructions: RTC 3 months with repeat labs.   I discussed the assessment and treatment plan with the patient. The patient was provided an opportunity to ask questions and all were answered. The patient agreed with the plan and demonstrated an understanding of the instructions.   The patient was advised to call back or seek an in-person evaluation if the symptoms worsen or if the condition fails to improve as anticipated.  I provided 11 minutes of non-face-to-face time during this encounter.   Doreatha Massed, MD

## 2021-07-09 ENCOUNTER — Other Ambulatory Visit (HOSPITAL_COMMUNITY): Payer: Self-pay

## 2021-07-09 DIAGNOSIS — D500 Iron deficiency anemia secondary to blood loss (chronic): Secondary | ICD-10-CM

## 2021-07-09 DIAGNOSIS — E559 Vitamin D deficiency, unspecified: Secondary | ICD-10-CM

## 2021-07-11 ENCOUNTER — Inpatient Hospital Stay (HOSPITAL_COMMUNITY): Payer: Commercial Managed Care - PPO | Attending: Hematology

## 2021-07-11 ENCOUNTER — Other Ambulatory Visit: Payer: Self-pay

## 2021-07-11 DIAGNOSIS — E559 Vitamin D deficiency, unspecified: Secondary | ICD-10-CM | POA: Insufficient documentation

## 2021-07-11 DIAGNOSIS — Z79899 Other long term (current) drug therapy: Secondary | ICD-10-CM | POA: Diagnosis not present

## 2021-07-11 DIAGNOSIS — D508 Other iron deficiency anemias: Secondary | ICD-10-CM | POA: Insufficient documentation

## 2021-07-11 DIAGNOSIS — E538 Deficiency of other specified B group vitamins: Secondary | ICD-10-CM | POA: Diagnosis present

## 2021-07-11 DIAGNOSIS — D500 Iron deficiency anemia secondary to blood loss (chronic): Secondary | ICD-10-CM

## 2021-07-11 LAB — CBC WITH DIFFERENTIAL/PLATELET
Abs Immature Granulocytes: 0 10*3/uL (ref 0.00–0.07)
Basophils Absolute: 0.1 10*3/uL (ref 0.0–0.1)
Basophils Relative: 1 %
Eosinophils Absolute: 0.2 10*3/uL (ref 0.0–0.5)
Eosinophils Relative: 6 %
HCT: 35.1 % — ABNORMAL LOW (ref 39.0–52.0)
Hemoglobin: 10.1 g/dL — ABNORMAL LOW (ref 13.0–17.0)
Immature Granulocytes: 0 %
Lymphocytes Relative: 51 %
Lymphs Abs: 1.8 10*3/uL (ref 0.7–4.0)
MCH: 22.4 pg — ABNORMAL LOW (ref 26.0–34.0)
MCHC: 28.8 g/dL — ABNORMAL LOW (ref 30.0–36.0)
MCV: 77.8 fL — ABNORMAL LOW (ref 80.0–100.0)
Monocytes Absolute: 0.3 10*3/uL (ref 0.1–1.0)
Monocytes Relative: 7 %
Neutro Abs: 1.3 10*3/uL — ABNORMAL LOW (ref 1.7–7.7)
Neutrophils Relative %: 35 %
Platelets: 241 10*3/uL (ref 150–400)
RBC: 4.51 MIL/uL (ref 4.22–5.81)
RDW: 15.1 % (ref 11.5–15.5)
WBC: 3.6 10*3/uL — ABNORMAL LOW (ref 4.0–10.5)
nRBC: 0 % (ref 0.0–0.2)

## 2021-07-11 LAB — IRON AND TIBC
Iron: 22 ug/dL — ABNORMAL LOW (ref 45–182)
Saturation Ratios: 4 % — ABNORMAL LOW (ref 17.9–39.5)
TIBC: 580 ug/dL — ABNORMAL HIGH (ref 250–450)
UIBC: 558 ug/dL

## 2021-07-11 LAB — FERRITIN: Ferritin: 4 ng/mL — ABNORMAL LOW (ref 24–336)

## 2021-07-11 LAB — VITAMIN D 25 HYDROXY (VIT D DEFICIENCY, FRACTURES): Vit D, 25-Hydroxy: 47.35 ng/mL (ref 30–100)

## 2021-07-11 LAB — VITAMIN B12: Vitamin B-12: 299 pg/mL (ref 180–914)

## 2021-07-15 ENCOUNTER — Telehealth (HOSPITAL_COMMUNITY): Payer: Self-pay

## 2021-07-15 NOTE — Telephone Encounter (Signed)
Returned patients call on 07/14/21 at 1528 with no answer. Tried again at 1530 and again no answer. No message was left.

## 2021-07-16 ENCOUNTER — Other Ambulatory Visit: Payer: Self-pay

## 2021-07-16 ENCOUNTER — Inpatient Hospital Stay (HOSPITAL_COMMUNITY): Payer: Commercial Managed Care - PPO

## 2021-07-16 VITALS — BP 110/60 | HR 73 | Temp 97.5°F | Resp 18

## 2021-07-16 DIAGNOSIS — D508 Other iron deficiency anemias: Secondary | ICD-10-CM | POA: Diagnosis not present

## 2021-07-16 DIAGNOSIS — D500 Iron deficiency anemia secondary to blood loss (chronic): Secondary | ICD-10-CM

## 2021-07-16 MED ORDER — SODIUM CHLORIDE 0.9 % IV SOLN
200.0000 mg | Freq: Once | INTRAVENOUS | Status: AC
  Administered 2021-07-16: 200 mg via INTRAVENOUS
  Filled 2021-07-16: qty 200

## 2021-07-16 MED ORDER — CLONIDINE HCL 0.1 MG PO TABS
0.2000 mg | ORAL_TABLET | Freq: Once | ORAL | Status: AC
  Administered 2021-07-16: 0.2 mg via ORAL
  Filled 2021-07-16: qty 2

## 2021-07-16 MED ORDER — SODIUM CHLORIDE 0.9 % IV SOLN: Freq: Once | INTRAVENOUS | Status: AC

## 2021-07-16 NOTE — Patient Instructions (Signed)
[name]  Discharge Instructions: Thank you for choosing [facility] to provide your oncology and hematology care.  If you have a lab appointment with the Cancer Center, please come in thru the Main Entrance and check in at the main information desk.  Wear comfortable clothing and clothing appropriate for easy access to any Portacath or PICC line.   We strive to give you quality time with your provider. You may need to reschedule your appointment if you arrive late (15 or more minutes).  Arriving late affects you and other patients whose appointments are after yours.  Also, if you miss three or more appointments without notifying the office, you may be dismissed from the clinic at the provider's discretion.      For prescription refill requests, have your pharmacy contact our office and allow 72 hours for refills to be completed.    Today you received the following : Venofer 200 mgs.       To help prevent nausea and vomiting after your treatment, we encourage you to take your nausea medication as directed.  BELOW ARE SYMPTOMS THAT SHOULD BE REPORTED IMMEDIATELY: *FEVER GREATER THAN 100.4 F (38 C) OR HIGHER *CHILLS OR SWEATING *NAUSEA AND VOMITING THAT IS NOT CONTROLLED WITH YOUR NAUSEA MEDICATION *UNUSUAL SHORTNESS OF BREATH *UNUSUAL BRUISING OR BLEEDING *URINARY PROBLEMS (pain or burning when urinating, or frequent urination) *BOWEL PROBLEMS (unusual diarrhea, constipation, pain near the anus) TENDERNESS IN MOUTH AND THROAT WITH OR WITHOUT PRESENCE OF ULCERS (sore throat, sores in mouth, or a toothache) UNUSUAL RASH, SWELLING OR PAIN  UNUSUAL VAGINAL DISCHARGE OR ITCHING   Items with * indicate a potential emergency and should be followed up as soon as possible or go to the Emergency Department if any problems should occur.  Please show the CHEMOTHERAPY ALERT CARD or IMMUNOTHERAPY ALERT CARD at check-in to the Emergency Department and triage  nurse.  Should you have questions after your visit or need to cancel or reschedule your appointment, please contact [facility] [phone]  and follow the prompts.  Office hours are 8:00 a.m. to 4:30 p.m. Monday - Friday. Please note that voicemails left after 4:00 p.m. may not be returned until the following business day.  We are closed weekends and major holidays. You have access to a nurse at all times for urgent questions. Please call the main number to the clinic [phone] and follow the prompts.  For any non-urgent questions, you may also contact your provider using MyChart. We now offer e-Visits for anyone 69 and older to request care online for non-urgent symptoms. For details visit [email or web address].   Also download the MyChart app! Go to the app store, search "MyChart", open the app, select [name], and log in with your MyChart username and password.  Due to Covid, a mask is required upon entering the hospital/clinic. If you do not have a mask, one will be given to you upon arrival. For doctor visits, patients may have 1 support person aged 19 or older with them. For treatment visits, patients cannot have anyone with them due to current Covid guidelines and our immunocompromised population.

## 2021-07-16 NOTE — Progress Notes (Signed)
Reported blood pressure to Dr. Ellin Saba. Verbal order received to give Clonidine 0.2 mg PO x 1 dose now.   Venofer 200 mgs given today per MD orders. Tolerated infusion without adverse affects. Vital signs stable. No complaints at this time. Discharged from clinic ambulatory in stable condition. Alert and oriented x 3. F/U with [facility] as scheduled.

## 2021-07-17 NOTE — Progress Notes (Signed)
Virtual Visit via Telephone Note [facility]  I connected with [facility] [name]  on 07/18/2021 at 9:54 AM by telephone and verified that I am speaking with the correct person using two identifiers.  Location: Patient: Home Provider: [facility]   I discussed the limitations, risks, security and privacy concerns of performing an evaluation and management service by telephone and the availability of in person appointments. I also discussed with the patient that there may be a patient responsible charge related to this service. The patient expressed understanding and agreed to proceed.   HISTORY OF PRESENT ILLNESS: [name] 60 y.o. 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  He was last evaluated via telemedicine visit by Dr. Ellin Saba on 03/17/2021.  He denies any overt signs of GI blood loss such as melena, hematochezia, or hemoptysis.  He does report that for the past several weeks he has had progressive fatigue (energy about 60%), dyspnea on exertion, and chest tightness on exertion.  He has experienced pica cravings for ice.  He denies any syncope, palpitations, headache, or dizziness.  He reports that the symptoms usually improve after he gets IV iron.   OBSERVATIONS/OBJECTIVE: Review of Systems  Constitutional:  Positive for malaise/fatigue. Negative for chills, diaphoresis, fever and weight loss.  Respiratory:  Negative for cough and shortness of breath.   Cardiovascular:  Negative for chest pain and palpitations.  Gastrointestinal:  Negative for abdominal pain, blood in stool, melena, nausea and vomiting.  Neurological:  Negative for dizziness and headaches.    PHYSICAL EXAM (per limitations of virtual telephone visit): The patient is alert and oriented x 3, exhibiting adequate mentation, good mood, and ability to speak in full sentences and execute sound  judgement.   ASSESSMENT & PLAN: 1.  Iron deficiency anemia: - Secondary to chronic GI blood loss - Last EGD and colonoscopy on 12/01/2016 showed AVMs and Cameron erosions - He denies any overt signs of blood loss such as bright red blood per rectum or melena - He is symptomatic with fatigue, dyspnea on exertion, and chest tightness on exertion - Most recent labs (07/11/2021): Hgb 10.1 with MCV 77.8, ferritin 4, iron saturation 4% with elevated TIBC 580 - Started on IV iron after labs noted above, received IV Venofer x200 mg on 07/16/2021, scheduled for additional 600 mg IV Venofer in 3 divided doses - PLAN: Continue with IV iron (Venofer x800 mg total).  We will repeat iron panel and CBC in 2 months.  He will call us back if he develops any symptoms of severe iron deficiency.  Recommend that he start taking ferrous sulfate daily to try to keep his iron stores stable and to decrease the frequency of iron infusions.  2.  Vitamin D deficiency: - His vitamin D level was low at his last visit, therefore started on vitamin D 50,000 units weekly - Most recent vitamin D (07/11/2021): Normal at 47.35 - PLAN: Continue vitamin D 50,000 units weekly.  We will recheck in 3 to 6 months.  3.  Vitamin B12 deficiency: - Most recent B12 (07/11/2021) marginal at 299, methylmalonic acid not checked - He reports that he has not been taking his B12 at home - PLAN: Encouraged patient to continue vitamin B12 (cyanocobalamin) 1000 mcg tablet daily.  We will recheck B12 and methylmalonic acid in 3 to 6 months.   FOLLOW UP INSTRUCTIONS: - Continue with IV Venofer  as scheduled - Repeat labs and RTC in 2 months (phone visit okay if patient prefers)    I discussed the assessment and treatment plan with the patient. The patient was provided an opportunity to ask questions and all were answered. The patient agreed with the plan and demonstrated an understanding of the instructions.   The patient was advised to call back  or seek an in-person evaluation if the symptoms worsen or if the condition fails to improve as anticipated.  I provided 11 minutes of non-face-to-face time during this encounter.   Carnella Guadalajara, PA-C 07/18/2021 10:07 AM

## 2021-07-18 ENCOUNTER — Encounter (HOSPITAL_COMMUNITY): Payer: Self-pay | Admitting: Physician Assistant

## 2021-07-18 ENCOUNTER — Ambulatory Visit (HOSPITAL_COMMUNITY): Payer: Commercial Managed Care - PPO

## 2021-07-18 ENCOUNTER — Inpatient Hospital Stay (HOSPITAL_COMMUNITY): Payer: Commercial Managed Care - PPO

## 2021-07-18 ENCOUNTER — Inpatient Hospital Stay (HOSPITAL_COMMUNITY): Payer: Commercial Managed Care - PPO | Attending: Physician Assistant | Admitting: Physician Assistant

## 2021-07-18 ENCOUNTER — Other Ambulatory Visit: Payer: Self-pay

## 2021-07-18 VITALS — BP 151/89 | HR 84 | Temp 98.1°F | Resp 18 | Wt 201.4 lb

## 2021-07-18 DIAGNOSIS — E538 Deficiency of other specified B group vitamins: Secondary | ICD-10-CM

## 2021-07-18 DIAGNOSIS — Z79899 Other long term (current) drug therapy: Secondary | ICD-10-CM | POA: Diagnosis not present

## 2021-07-18 DIAGNOSIS — D500 Iron deficiency anemia secondary to blood loss (chronic): Secondary | ICD-10-CM | POA: Diagnosis not present

## 2021-07-18 DIAGNOSIS — E559 Vitamin D deficiency, unspecified: Secondary | ICD-10-CM

## 2021-07-18 DIAGNOSIS — D508 Other iron deficiency anemias: Secondary | ICD-10-CM | POA: Diagnosis not present

## 2021-07-18 MED ORDER — SODIUM CHLORIDE 0.9 % IV SOLN: Freq: Once | INTRAVENOUS | Status: AC

## 2021-07-18 MED ORDER — SODIUM CHLORIDE 0.9 % IV SOLN
200.0000 mg | Freq: Once | INTRAVENOUS | Status: AC
  Administered 2021-07-18: 200 mg via INTRAVENOUS
  Filled 2021-07-18: qty 200

## 2021-07-18 NOTE — Progress Notes (Signed)
Patient presents today for iron infusion.  Patient has no new complaints today.  Patient is in satisfactory condition and vital signs are stable.   Patient tolerated treatment well with no complaints voiced.  Patient refused 30 minute post infusion wait time and left once the infusion was complete.  Patient left ambulatory in stable condition.  Follow up as scheduled.

## 2021-07-18 NOTE — Patient Instructions (Signed)
[facility]  Discharge Instructions: Thank you for choosing [facility] to provide your oncology and hematology care.  If you have a lab appointment with the Cancer Center, please come in thru the Main Entrance and check in at the main information desk.  We strive to give you quality time with your provider. You may need to reschedule your appointment if you arrive late (15 or more minutes).  Arriving late affects you and other patients whose appointments are after yours.  Also, if you miss three or more appointments without notifying the office, you may be dismissed from the clinic at the provider's discretion.      For prescription refill requests, have your pharmacy contact our office and allow 72 hours for refills to be completed.     To help prevent nausea and vomiting after your treatment, we encourage you to take your nausea medication as directed.  BELOW ARE SYMPTOMS THAT SHOULD BE REPORTED IMMEDIATELY: *FEVER GREATER THAN 100.4 F (38 C) OR HIGHER *CHILLS OR SWEATING *NAUSEA AND VOMITING THAT IS NOT CONTROLLED WITH YOUR NAUSEA MEDICATION *UNUSUAL SHORTNESS OF BREATH *UNUSUAL BRUISING OR BLEEDING *URINARY PROBLEMS (pain or burning when urinating, or frequent urination) *BOWEL PROBLEMS (unusual diarrhea, constipation, pain near the anus) TENDERNESS IN MOUTH AND THROAT WITH OR WITHOUT PRESENCE OF ULCERS (sore throat, sores in mouth, or a toothache) UNUSUAL RASH, SWELLING OR PAIN  UNUSUAL VAGINAL DISCHARGE OR ITCHING   Items with * indicate a potential emergency and should be followed up as soon as possible or go to the Emergency Department if any problems should occur.  Should you have questions after your visit or need to cancel or reschedule your appointment, please contact [facility] [phone]  and follow the prompts.  Office hours are 8:00 a.m. to 4:30 p.m. Monday - Friday. Please note that voicemails left after 4:00 p.m. may not be  returned until the following business day.  We are closed weekends and major holidays. You have access to a nurse at all times for urgent questions. Please call the main number to the clinic [phone] and follow the prompts.  For any non-urgent questions, you may also contact your provider using MyChart. We now offer e-Visits for anyone 18 and older to request care online for non-urgent symptoms. For details visit [email or web address].   Also download the MyChart app! Go to the app store, search "MyChart", open the app, select [facility], and log in with your MyChart username and password.  Due to Covid, a mask is required upon entering the hospital/clinic. If you do not have a mask, one will be given to you upon arrival. For doctor visits, patients may have 1 support person aged 18 or older with them. For treatment visits, patients cannot have anyone with them due to current Covid guidelines and our immunocompromised population.  

## 2021-07-23 ENCOUNTER — Encounter (HOSPITAL_COMMUNITY): Payer: Self-pay

## 2021-07-23 ENCOUNTER — Inpatient Hospital Stay (HOSPITAL_COMMUNITY): Payer: Commercial Managed Care - PPO

## 2021-07-23 ENCOUNTER — Other Ambulatory Visit: Payer: Self-pay

## 2021-07-23 VITALS — BP 175/82 | HR 70 | Temp 97.8°F | Resp 18

## 2021-07-23 DIAGNOSIS — D500 Iron deficiency anemia secondary to blood loss (chronic): Secondary | ICD-10-CM

## 2021-07-23 DIAGNOSIS — D508 Other iron deficiency anemias: Secondary | ICD-10-CM | POA: Diagnosis not present

## 2021-07-23 MED ORDER — SODIUM CHLORIDE 0.9 % IV SOLN
200.0000 mg | Freq: Once | INTRAVENOUS | Status: AC
  Administered 2021-07-23: 200 mg via INTRAVENOUS
  Filled 2021-07-23: qty 200

## 2021-07-23 MED ORDER — SODIUM CHLORIDE 0.9 % IV SOLN: Freq: Once | INTRAVENOUS | Status: AC

## 2021-07-23 NOTE — Patient Instructions (Signed)
[facility]  Discharge Instructions: Thank you for choosing [facility] to provide your oncology and hematology care.  If you have a lab appointment with the Cancer Center, please come in thru the Main Entrance and check in at the main information desk.  Wear comfortable clothing and clothing appropriate for easy access to any Portacath or PICC line.   We strive to give you quality time with your provider. You may need to reschedule your appointment if you arrive late (15 or more minutes).  Arriving late affects you and other patients whose appointments are after yours.  Also, if you miss three or more appointments without notifying the office, you may be dismissed from the clinic at the provider's discretion.      For prescription refill requests, have your pharmacy contact our office and allow 72 hours for refills to be completed.    Today you received Venofer 300 mg IV iron infusion.  BELOW ARE SYMPTOMS THAT SHOULD BE REPORTED IMMEDIATELY: *FEVER GREATER THAN 100.4 F (38 C) OR HIGHER *CHILLS OR SWEATING *NAUSEA AND VOMITING THAT IS NOT CONTROLLED WITH YOUR NAUSEA MEDICATION *UNUSUAL SHORTNESS OF BREATH *UNUSUAL BRUISING OR BLEEDING *URINARY PROBLEMS (pain or burning when urinating, or frequent urination) *BOWEL PROBLEMS (unusual diarrhea, constipation, pain near the anus) TENDERNESS IN MOUTH AND THROAT WITH OR WITHOUT PRESENCE OF ULCERS (sore throat, sores in mouth, or a toothache) UNUSUAL RASH, SWELLING OR PAIN  UNUSUAL VAGINAL DISCHARGE OR ITCHING   Items with * indicate a potential emergency and should be followed up as soon as possible or go to the Emergency Department if any problems should occur.  Please show the CHEMOTHERAPY ALERT CARD or IMMUNOTHERAPY ALERT CARD at check-in to the Emergency Department and triage nurse.  Should you have questions after your visit or need to cancel or reschedule your appointment, please contact [facility]  [phone]  and follow the prompts.  Office hours are 8:00 a.m. to 4:30 p.m. Monday - Friday. Please note that voicemails left after 4:00 p.m. may not be returned until the following business day.  We are closed weekends and major holidays. You have access to a nurse at all times for urgent questions. Please call the main number to the clinic [phone] and follow the prompts.  For any non-urgent questions, you may also contact your provider using MyChart. We now offer e-Visits for anyone 18 and older to request care online for non-urgent symptoms. For details visit [email or web address].   Also download the MyChart app! Go to the app store, search "MyChart", open the app, select , and log in with your MyChart username and password.  Due to Covid, a mask is required upon entering the hospital/clinic. If you do not have a mask, one will be given to you upon arrival. For doctor visits, patients may have 1 support person aged 18 or older with them. For treatment visits, patients cannot have anyone with them due to current Covid guidelines and our immunocompromised population.  

## 2021-07-23 NOTE — Progress Notes (Signed)
Pt presents today for Venofer per provider's order.MD made aware of pt's blood pressure. Okay to proceed with iron infusion per Dr.K. Pt voiced no new complaints at this time.   Venofer given today per MD orders. Tolerated infusion without adverse affects. Vital signs stable. No complaints at this time. Discharged from clinic ambulatory in stable condition. Alert and oriented x 3. F/U with [facility] as scheduled.

## 2021-07-25 ENCOUNTER — Ambulatory Visit (HOSPITAL_COMMUNITY): Payer: Commercial Managed Care - PPO

## 2021-07-28 ENCOUNTER — Inpatient Hospital Stay (HOSPITAL_COMMUNITY): Payer: Commercial Managed Care - PPO

## 2021-07-28 ENCOUNTER — Other Ambulatory Visit: Payer: Self-pay

## 2021-07-28 ENCOUNTER — Encounter (HOSPITAL_COMMUNITY): Payer: Self-pay | Admitting: Hematology

## 2021-07-28 VITALS — BP 170/103 | HR 74 | Temp 96.7°F | Resp 18

## 2021-07-28 DIAGNOSIS — D508 Other iron deficiency anemias: Secondary | ICD-10-CM | POA: Diagnosis not present

## 2021-07-28 DIAGNOSIS — D500 Iron deficiency anemia secondary to blood loss (chronic): Secondary | ICD-10-CM

## 2021-07-28 MED ORDER — SODIUM CHLORIDE 0.9 % IV SOLN: Freq: Once | INTRAVENOUS | Status: AC

## 2021-07-28 MED ORDER — SODIUM CHLORIDE 0.9 % IV SOLN
200.0000 mg | Freq: Once | INTRAVENOUS | Status: AC
  Administered 2021-07-28: 200 mg via INTRAVENOUS
  Filled 2021-07-28: qty 200

## 2021-07-28 NOTE — Progress Notes (Signed)
Patient presents today for iron infusion.  BP is elevated today.   Dr. Ellin Saba was notified and he wants to proceed with treatment as patient has had his BP medication today.  Patient is in satisfactory condition with no complaints voiced.  Other vital signs are stable.   Patient tolerated treatment well with no complaints voiced.  Patient left ambulatory in stable condition.  Vital signs stable at discharge.  Follow up as scheduled.

## 2021-07-28 NOTE — Patient Instructions (Signed)
[facility]  Discharge Instructions: Thank you for choosing [facility] to provide your oncology and hematology care.  If you have a lab appointment with the Cancer Center, please come in thru the Main Entrance and check in at the main information desk.  We strive to give you quality time with your provider. You may need to reschedule your appointment if you arrive late (15 or more minutes).  Arriving late affects you and other patients whose appointments are after yours.  Also, if you miss three or more appointments without notifying the office, you may be dismissed from the clinic at the provider's discretion.      For prescription refill requests, have your pharmacy contact our office and allow 72 hours for refills to be completed.     To help prevent nausea and vomiting after your treatment, we encourage you to take your nausea medication as directed.  BELOW ARE SYMPTOMS THAT SHOULD BE REPORTED IMMEDIATELY: *FEVER GREATER THAN 100.4 F (38 C) OR HIGHER *CHILLS OR SWEATING *NAUSEA AND VOMITING THAT IS NOT CONTROLLED WITH YOUR NAUSEA MEDICATION *UNUSUAL SHORTNESS OF BREATH *UNUSUAL BRUISING OR BLEEDING *URINARY PROBLEMS (pain or burning when urinating, or frequent urination) *BOWEL PROBLEMS (unusual diarrhea, constipation, pain near the anus) TENDERNESS IN MOUTH AND THROAT WITH OR WITHOUT PRESENCE OF ULCERS (sore throat, sores in mouth, or a toothache) UNUSUAL RASH, SWELLING OR PAIN  UNUSUAL VAGINAL DISCHARGE OR ITCHING   Items with * indicate a potential emergency and should be followed up as soon as possible or go to the Emergency Department if any problems should occur.  Should you have questions after your visit or need to cancel or reschedule your appointment, please contact [facility] [phone]  and follow the prompts.  Office hours are 8:00 a.m. to 4:30 p.m. Monday - Friday. Please note that voicemails left after 4:00 p.m. may not be  returned until the following business day.  We are closed weekends and major holidays. You have access to a nurse at all times for urgent questions. Please call the main number to the clinic [phone] and follow the prompts.  For any non-urgent questions, you may also contact your provider using MyChart. We now offer e-Visits for anyone 18 and older to request care online for non-urgent symptoms. For details visit [email or web address].   Also download the MyChart app! Go to the app store, search "MyChart", open the app, select [facility], and log in with your MyChart username and password.  Due to Covid, a mask is required upon entering the hospital/clinic. If you do not have a mask, one will be given to you upon arrival. For doctor visits, patients may have 1 support person aged 18 or older with them. For treatment visits, patients cannot have anyone with them due to current Covid guidelines and our immunocompromised population.  

## 2021-08-27 ENCOUNTER — Other Ambulatory Visit (HOSPITAL_COMMUNITY): Payer: Self-pay | Admitting: Hematology

## 2021-08-27 DIAGNOSIS — E559 Vitamin D deficiency, unspecified: Secondary | ICD-10-CM

## 2021-09-23 ENCOUNTER — Other Ambulatory Visit: Payer: Self-pay

## 2021-09-23 ENCOUNTER — Inpatient Hospital Stay (HOSPITAL_COMMUNITY): Payer: Commercial Managed Care - PPO | Attending: Hematology

## 2021-09-23 DIAGNOSIS — E559 Vitamin D deficiency, unspecified: Secondary | ICD-10-CM | POA: Insufficient documentation

## 2021-09-23 DIAGNOSIS — Z79899 Other long term (current) drug therapy: Secondary | ICD-10-CM | POA: Diagnosis not present

## 2021-09-23 DIAGNOSIS — D500 Iron deficiency anemia secondary to blood loss (chronic): Secondary | ICD-10-CM | POA: Insufficient documentation

## 2021-09-23 DIAGNOSIS — E538 Deficiency of other specified B group vitamins: Secondary | ICD-10-CM | POA: Insufficient documentation

## 2021-09-23 DIAGNOSIS — Q2730 Arteriovenous malformation, site unspecified: Secondary | ICD-10-CM | POA: Diagnosis present

## 2021-09-23 LAB — CBC WITH DIFFERENTIAL/PLATELET
Abs Immature Granulocytes: 0.02 10*3/uL (ref 0.00–0.07)
Basophils Absolute: 0.1 10*3/uL (ref 0.0–0.1)
Basophils Relative: 1 %
Eosinophils Absolute: 0.2 10*3/uL (ref 0.0–0.5)
Eosinophils Relative: 5 %
HCT: 45.4 % (ref 39.0–52.0)
Hemoglobin: 13.9 g/dL (ref 13.0–17.0)
Immature Granulocytes: 1 %
Lymphocytes Relative: 42 %
Lymphs Abs: 1.8 10*3/uL (ref 0.7–4.0)
MCH: 25.5 pg — ABNORMAL LOW (ref 26.0–34.0)
MCHC: 30.6 g/dL (ref 30.0–36.0)
MCV: 83.3 fL (ref 80.0–100.0)
Monocytes Absolute: 0.3 10*3/uL (ref 0.1–1.0)
Monocytes Relative: 7 %
Neutro Abs: 1.9 10*3/uL (ref 1.7–7.7)
Neutrophils Relative %: 44 %
Platelets: 251 10*3/uL (ref 150–400)
RBC: 5.45 MIL/uL (ref 4.22–5.81)
RDW: 19.1 % — ABNORMAL HIGH (ref 11.5–15.5)
WBC: 4.2 10*3/uL (ref 4.0–10.5)
nRBC: 0 % (ref 0.0–0.2)

## 2021-09-23 LAB — IRON AND TIBC
Iron: 56 ug/dL (ref 45–182)
Saturation Ratios: 10 % — ABNORMAL LOW (ref 17.9–39.5)
TIBC: 539 ug/dL — ABNORMAL HIGH (ref 250–450)
UIBC: 483 ug/dL

## 2021-09-23 LAB — FERRITIN: Ferritin: 11 ng/mL — ABNORMAL LOW (ref 24–336)

## 2021-09-23 NOTE — Progress Notes (Signed)
Virtual Visit via Telephone Note [facility]  I connected with Troy [name]  on 09/24/2021 at 8:21 AM by telephone and verified that I am speaking with the correct person using two identifiers.  Location: Patient: Home Provider: [facility]   I discussed the limitations, risks, security and privacy concerns of performing an evaluation and management service by telephone and the availability of in person appointments. I also discussed with the patient that there may be a patient responsible charge related to this service. The patient expressed understanding and agreed to proceed.   HISTORY OF PRESENT ILLNESS: [name] 60 y.o. 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  He was last evaluated via telemedicine visit with Rojelio Brenner PA-C on 07/18/2021.  He received IV iron with Venofer 800 mg in 4 divided doses from 07/16/2021 through 07/28/2021.  He felt somewhat better after IV iron, but not as much improvement as he usually feels.  He continues to have some fatigue, leg cramps, and dyspnea on exertion with chest tightness on exertion.  He reports that he is starting to crave ice again.  No syncope, palpitations, headache, dizziness.  He denies any overt signs of GI blood loss such as melena, hematochezia, or hemoptysis.  He noted some scant rectal bleeding after straining for a bowel movement.  He has been taking ibuprofen every other day for his leg cramps, but was advised to stop this.  He has not been regularly taking his oral iron segmentation.  He reports that he takes his vitamin D and B12 as ordered.  Energy today is 70%, with appetite 100%.  He reports that he is maintaining a stable weight at this time.    OBSERVATIONS/OBJECTIVE: Review of Systems  Constitutional:  Positive for malaise/fatigue. Negative for chills, diaphoresis, fever and weight loss.   Respiratory:  Positive for shortness of breath (With exertion). Negative for cough.   Cardiovascular:  Positive for chest pain (Tightness). Negative for palpitations.  Gastrointestinal:  Negative for abdominal pain, blood in stool, melena, nausea and vomiting.  Genitourinary:  Positive for frequency.  Musculoskeletal:  Positive for myalgias.  Neurological:  Negative for dizziness and headaches.  Psychiatric/Behavioral:  Positive for depression. Negative for suicidal ideas. The patient has insomnia.     PHYSICAL EXAM (per limitations of virtual telephone visit): The patient is alert and oriented x 3, exhibiting adequate mentation, good mood, and ability to speak in full sentences and execute sound judgement.   ASSESSMENT & PLAN: 1.  Iron deficiency anemia: - Secondary to chronic GI blood loss - Last EGD and colonoscopy on 12/01/2016 showed AVMs and Cameron erosions - He received IV iron with Venofer 800 mg in 4 divided doses from 07/16/2021 through 07/28/2021 - He has not been taking his oral iron regularly. - Symptoms after IV iron are improved, but not resolved.  He continues to have fatigue, dyspnea on exertion, chest tightness on exertion, pica, and leg cramps. - He denies any overt signs of blood loss such as bright red blood per rectum or melena . - Most recent labs (09/23/2021): Hgb 13.9, ferritin 11, iron saturation 10% with TIBC 539 - PLAN: Anemia has resolved, patient has persistent iron deficiency. - Recommend additional IV Venofer 300 mg x 3 months in divided doses. - We will repeat iron panel and CBC in 2 months. - He will call us back if he develops any  symptoms of severe iron deficiency. - Continue taking ferrous sulfate daily to try to keep his iron stores stable and to decrease the frequency of iron infusions. - Advised to avoid NSAIDs/ibuprofen.   2.  Vitamin D deficiency: - His vitamin D level was low at his last visit, therefore started on vitamin D 50,000 units weekly -  Most recent vitamin D (07/11/2021): Normal at 47.35 - PLAN: Continue vitamin D 50,000 units weekly.  We will recheck in 3 to 6 months.   3.  Vitamin B12 deficiency: - Most recent B12 (07/11/2021) marginal at 299, methylmalonic acid not checked - He reports that he has not been taking his B12 at home - PLAN: Encouraged patient to continue vitamin B12 (cyanocobalamin) 1000 mcg tablet daily.  We will recheck B12 and methylmalonic acid in 3 to 6 months.    FOLLOW UP INSTRUCTIONS: -IV Venofer 300 mg x 3 - Labs in 2 months (CBC, iron panel, B12, vitamin D) - Phone visit in 2 months, after labs    I discussed the assessment and treatment plan with the patient. The patient was provided an opportunity to ask questions and all were answered. The patient agreed with the plan and demonstrated an understanding of the instructions.   The patient was advised to call back or seek an in-person evaluation if the symptoms worsen or if the condition fails to improve as anticipated.  I provided 21 minutes of non-face-to-face time during this encounter.   Carnella Guadalajara, PA-C 09/24/2021 8:45 AM

## 2021-09-24 ENCOUNTER — Inpatient Hospital Stay (HOSPITAL_BASED_OUTPATIENT_CLINIC_OR_DEPARTMENT_OTHER): Payer: Commercial Managed Care - PPO | Admitting: Physician Assistant

## 2021-09-24 DIAGNOSIS — E538 Deficiency of other specified B group vitamins: Secondary | ICD-10-CM | POA: Diagnosis not present

## 2021-09-24 DIAGNOSIS — E559 Vitamin D deficiency, unspecified: Secondary | ICD-10-CM

## 2021-09-24 DIAGNOSIS — D500 Iron deficiency anemia secondary to blood loss (chronic): Secondary | ICD-10-CM

## 2021-09-25 ENCOUNTER — Inpatient Hospital Stay (HOSPITAL_COMMUNITY): Payer: Commercial Managed Care - PPO

## 2021-09-25 ENCOUNTER — Other Ambulatory Visit: Payer: Self-pay

## 2021-09-25 VITALS — BP 170/95 | HR 67 | Temp 98.4°F | Resp 18

## 2021-09-25 DIAGNOSIS — D500 Iron deficiency anemia secondary to blood loss (chronic): Secondary | ICD-10-CM | POA: Diagnosis not present

## 2021-09-25 MED ORDER — ACETAMINOPHEN 325 MG PO TABS
650.0000 mg | ORAL_TABLET | Freq: Once | ORAL | Status: AC
  Administered 2021-09-25: 650 mg via ORAL
  Filled 2021-09-25: qty 2

## 2021-09-25 MED ORDER — LORATADINE 10 MG PO TABS
10.0000 mg | ORAL_TABLET | Freq: Once | ORAL | Status: AC
  Administered 2021-09-25: 10 mg via ORAL
  Filled 2021-09-25: qty 1

## 2021-09-25 MED ORDER — SODIUM CHLORIDE 0.9 % IV SOLN
300.0000 mg | Freq: Once | INTRAVENOUS | Status: AC
  Administered 2021-09-25: 300 mg via INTRAVENOUS
  Filled 2021-09-25: qty 10

## 2021-09-25 MED ORDER — SODIUM CHLORIDE 0.9 % IV SOLN: Freq: Once | INTRAVENOUS | Status: AC

## 2021-09-25 NOTE — Patient Instructions (Signed)
[facility]  Discharge Instructions: Thank you for choosing [facility] to provide your oncology and hematology care.  If you have a lab appointment with the Cancer Center, please come in thru the Main Entrance and check in at the main information desk.  Wear comfortable clothing and clothing appropriate for easy access to any Portacath or PICC line.   We strive to give you quality time with your provider. You may need to reschedule your appointment if you arrive late (15 or more minutes).  Arriving late affects you and other patients whose appointments are after yours.  Also, if you miss three or more appointments without notifying the office, you may be dismissed from the clinic at the provider's discretion.      For prescription refill requests, have your pharmacy contact our office and allow 72 hours for refills to be completed.        To help prevent nausea and vomiting after your treatment, we encourage you to take your nausea medication as directed.  BELOW ARE SYMPTOMS THAT SHOULD BE REPORTED IMMEDIATELY: *FEVER GREATER THAN 100.4 F (38 C) OR HIGHER *CHILLS OR SWEATING *NAUSEA AND VOMITING THAT IS NOT CONTROLLED WITH YOUR NAUSEA MEDICATION *UNUSUAL SHORTNESS OF BREATH *UNUSUAL BRUISING OR BLEEDING *URINARY PROBLEMS (pain or burning when urinating, or frequent urination) *BOWEL PROBLEMS (unusual diarrhea, constipation, pain near the anus) TENDERNESS IN MOUTH AND THROAT WITH OR WITHOUT PRESENCE OF ULCERS (sore throat, sores in mouth, or a toothache) UNUSUAL RASH, SWELLING OR PAIN  UNUSUAL VAGINAL DISCHARGE OR ITCHING   Items with * indicate a potential emergency and should be followed up as soon as possible or go to the Emergency Department if any problems should occur.  Please show the CHEMOTHERAPY ALERT CARD or IMMUNOTHERAPY ALERT CARD at check-in to the Emergency Department and triage nurse.  Should you have questions after your visit or need to cancel  or reschedule your appointment, please contact  CANCER CENTER [phone]  and follow the prompts.  Office hours are 8:00 a.m. to 4:30 p.m. Monday - Friday. Please note that voicemails left after 4:00 p.m. may not be returned until the following business day.  We are closed weekends and major holidays. You have access to a nurse at all times for urgent questions. Please call the main number to the clinic [phone] and follow the prompts.  For any non-urgent questions, you may also contact your provider using MyChart. We now offer e-Visits for anyone 18 and older to request care online for non-urgent symptoms. For details visit [email or web address].   Also download the MyChart app! Go to the app store, search "MyChart", open the app, select [place], and log in with your MyChart username and password.  Due to Covid, a mask is required upon entering the hospital/clinic. If you do not have a mask, one will be given to you upon arrival. For doctor visits, patients may have 1 support person aged 18 or older with them. For treatment visits, patients cannot have anyone with them due to current Covid guidelines and our immunocompromised population.  

## 2021-09-25 NOTE — Progress Notes (Signed)
Patient presents today for iron infusion.  Patient is in satisfactory condition with no complaints voiced.  Vital signs are stable.  We will proceed with treatment per MD orders.  Patient tolerated treatment well with no complaints voiced.  Patient left ambulatory in stable condition.  Vital signs stable at discharge.  Follow up as scheduled.    

## 2021-10-02 ENCOUNTER — Encounter (HOSPITAL_COMMUNITY): Payer: Self-pay

## 2021-10-02 ENCOUNTER — Inpatient Hospital Stay (HOSPITAL_COMMUNITY): Payer: Commercial Managed Care - PPO

## 2021-10-02 ENCOUNTER — Other Ambulatory Visit: Payer: Self-pay

## 2021-10-02 VITALS — BP 145/93 | HR 71 | Temp 97.0°F | Resp 18

## 2021-10-02 DIAGNOSIS — D500 Iron deficiency anemia secondary to blood loss (chronic): Secondary | ICD-10-CM

## 2021-10-02 MED ORDER — SODIUM CHLORIDE 0.9 % IV SOLN
300.0000 mg | Freq: Once | INTRAVENOUS | Status: AC
  Administered 2021-10-02: 10:00:00 300 mg via INTRAVENOUS
  Filled 2021-10-02: qty 300

## 2021-10-02 MED ORDER — SODIUM CHLORIDE 0.9 % IV SOLN: Freq: Once | INTRAVENOUS | Status: AC

## 2021-10-02 NOTE — Progress Notes (Signed)
Patient tolerated iron infusion with no complaints voiced. Peripheral IV site clean and dry with good blood return noted before and after infusion. Band aid applied. Patient declined ACS. VSS with discharge and left in satisfactory condition with no s/s of distress noted.

## 2021-10-02 NOTE — Patient Instructions (Signed)
[facility]  Discharge Instructions: Thank you for choosing [facility] to provide your oncology and hematology care.  If you have a lab appointment with the Cancer Center, please come in thru the Main Entrance and check in at the main information desk.  Wear comfortable clothing and clothing appropriate for easy access to any Portacath or PICC line.   We strive to give you quality time with your provider. You may need to reschedule your appointment if you arrive late (15 or more minutes).  Arriving late affects you and other patients whose appointments are after yours.  Also, if you miss three or more appointments without notifying the office, you may be dismissed from the clinic at the provider's discretion.      For prescription refill requests, have your pharmacy contact our office and allow 72 hours for refills to be completed.    Today you received the following: Venofer, return as scheduled.   To help prevent nausea and vomiting after your treatment, we encourage you to take your nausea medication as directed.  BELOW ARE SYMPTOMS THAT SHOULD BE REPORTED IMMEDIATELY: *FEVER GREATER THAN 100.4 F (38 C) OR HIGHER *CHILLS OR SWEATING *NAUSEA AND VOMITING THAT IS NOT CONTROLLED WITH YOUR NAUSEA MEDICATION *UNUSUAL SHORTNESS OF BREATH *UNUSUAL BRUISING OR BLEEDING *URINARY PROBLEMS (pain or burning when urinating, or frequent urination) *BOWEL PROBLEMS (unusual diarrhea, constipation, pain near the anus) TENDERNESS IN MOUTH AND THROAT WITH OR WITHOUT PRESENCE OF ULCERS (sore throat, sores in mouth, or a toothache) UNUSUAL RASH, SWELLING OR PAIN  UNUSUAL VAGINAL DISCHARGE OR ITCHING   Items with * indicate a potential emergency and should be followed up as soon as possible or go to the Emergency Department if any problems should occur.  Please show the CHEMOTHERAPY ALERT CARD or IMMUNOTHERAPY ALERT CARD at check-in to the Emergency Department and triage  nurse.  Should you have questions after your visit or need to cancel or reschedule your appointment, please contact  CANC[facility] CENTER [phone]  and follow the prompts.  Office hours are 8:00 a.m. to 4:30 p.m. Monday - Friday. Please note that voicemails left after 4:00 p.m. may not be returned until the following business day.  We are closed weekends and major holidays. You have access to a nurse at all times for urgent questions. Please call the main number to the clinic [phone] and follow the prompts.  For any non-urgent questions, you may also contact your provider using MyChart. We now offer e-Visits for anyone 18 and older to request care online for non-urgent symptoms. For details visit [email or web address].   Also download the MyChart app! Go to the app store, search "MyChart", open the app, select [place], and log in with your MyChart username and password.  Due to Covid, a mask is required upon entering the hospital/clinic. If you do not have a mask, one will be given to you upon arrival. For doctor visits, patients may have 1 support person aged 18 or older with them. For treatment visits, patients cannot have anyone with them due to current Covid guidelines and our immunocompromised population.  

## 2021-10-02 NOTE — Progress Notes (Signed)
Cardiology Office Note:    Date:  10/03/2021   ID:  Fredderick [name], DOB Dec 29, 1960, MRN [id number]  PCP:  Gareth Morgan, MD  Cardiologist:  None  Electrophysiologist:  None   Referring MD: Gareth Morgan, MD   Chief Complaint  Patient presents with   Chest Pain    History of Present Illness:    Troy [name] is a 60 y.o. male with a hx of alcohol abuse, hypertension, IDA is referred by Dr. Sudie Bailey for evaluation of chest pain.  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  He has been receiving IV iron, most recent hemoglobin up to 13.9 on 09/23/2021.  Reports has been having chest pain about once per week, though recently has improved.  Describes as pressure in center of his chest, typically last for few minutes when it occurs.  Particularly notices it with exertion.  Can occur when he is walking his dog, particularly going uphill.  He denies any dyspnea, lightheadedness, or syncope.  Does report occasional lower extremity edema.  No palpitations.  No smoking history.  Drinks 2-8 beers per day.  Family history includes father had MI at age 33 and mother died of CVA at age 57.   Past Medical History:  Diagnosis Date   Alcohol dependence (HCC)    Anxiety    Chest pain    GERD (gastroesophageal reflux disease)    Hypertension    Insomnia    Iron deficiency anemia 11/25/2016   Plantar fascial fibromatosis    Psoriasis     Past Surgical History:  Procedure Laterality Date   BIOPSY  12/01/2016   Procedure: BIOPSY;  Surgeon: West Bali, MD;  Location: [facility];  Service: Endoscopy;;  gastric duodenal hiatal hernia pouch   COLONOSCOPY WITH PROPOFOL N/A 12/01/2016   Procedure: COLONOSCOPY WITH PROPOFOL;  Surgeon: West Bali, MD;  Location: [facility];  Service: Endoscopy;  Laterality: N/A;  1:45 pm   ESOPHAGOGASTRODUODENOSCOPY  2004   Dr. 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, unknown path     ESOPHAGOGASTRODUODENOSCOPY (EGD) WITH PROPOFOL N/A 12/01/2016   Procedure: ESOPHAGOGASTRODUODENOSCOPY (EGD) WITH PROPOFOL;  Surgeon: West Bali, MD;  Location: [facility];  Service: Endoscopy;  Laterality: N/A;   GIVENS CAPSULE STUDY N/A 12/23/2016   Procedure: GIVENS CAPSULE STUDY;  Surgeon: West Bali, MD;  Location: [facility];  Service: Endoscopy;  Laterality: N/A;  arrive at 7:00am for 7:30am appt time   HEMORRHOID SURGERY     residual skin tags   right ankle     UMBILICAL HERNIA REPAIR N/A 06/07/2014   Procedure: HERNIA REPAIR UMBILICAL ADULT;  Surgeon: Marlane Hatcher, MD;  Location: [facility];  Service: General;  Laterality: N/A;    Current Medications: Current Meds  Medication Sig   Cyanocobalamin (B-12) 5000 MCG SUBL Place 5,000 mcg under the tongue daily.   FEROSUL 325 (65 Fe) MG tablet Take 325 mg by mouth 2 (two) times daily.   losartan (COZAAR) 100 MG tablet Take 100 mg by mouth every morning.   losartan (COZAAR) 100 MG tablet    metoprolol tartrate (LOPRESSOR) 100 MG tablet Take 100 mg 2 hours before cardiac CT   omeprazole (PRILOSEC) 20 MG capsule 1 PO 30 mins prior to breakfast   sildenafil (VIAGRA) 100 MG tablet SMARTSIG:1 Tablet(s) By Mouth   Vitamin D, Ergocalciferol, (DRISDOL) 1.25 MG (50000 UNIT) CAPS capsule TAKE ONE CAPSULE (50,000 UNITS  TOTAL) BYMOUTH ONCE A WEEK     Allergies:   Patient has no known allergies.   Social History   Socioeconomic History   Marital status: Married    Spouse name: Not on file   Number of children: Not on file   Years of education: Not on file   Highest education level: Not on file  Occupational History   Not on file  Tobacco Use   Smoking status: Never   Smokeless tobacco: Never  Vaping Use   Vaping Use: Never used  Substance and Sexual Activity   Alcohol use: Yes    Comment: 6-12 beers at a time sometimes daily.    Drug use: No   Sexual activity: Yes  Other Topics Concern   Not on file  Social History  Narrative   Not on file   Social Determinants of Health   Financial Resource Strain: Not on file  Food Insecurity: Not on file  Transportation Needs: Not on file  Physical Activity: Not on file  Stress: Not on file  Social Connections: Not on file     Family History: The patient's family history includes Colon cancer in his paternal grandfather; Heart attack in his father and maternal grandmother; Heart disease in his mother.  ROS:   Please see the history of present illness.     All other systems reviewed and are negative.  EKGs/Labs/Other Studies Reviewed:    The following studies were reviewed today:   EKG:  EKG is  ordered today.  The ekg ordered today demonstrates sinus rhythm, PVC, rate 78, T wave inversions in inferior leads and V4-6  Recent Labs: 03/10/2021: ALT 25; TSH 1.260 09/23/2021: Hemoglobin 13.9; Platelets 251 10/03/2021: BUN 17; Creatinine, Ser 0.97; Potassium 4.9; Sodium 139  Recent Lipid Panel    Component Value Date/Time   CHOL 190 03/10/2021 1401   TRIG 107 03/10/2021 1401   HDL 63 03/10/2021 1401   CHOLHDL 3.0 03/10/2021 1401   VLDL 21 03/10/2021 1401   LDLCALC 106 (H) 03/10/2021 1401    Physical Exam:    VS:  BP (!) 160/90   Pulse 87   Ht 6\' 2"  (1.88 m)   Wt 206 lb 6.4 oz (93.6 kg)   SpO2 99%   BMI 26.50 kg/m     Wt Readings from Last 3 Encounters:  10/03/21 206 lb 6.4 oz (93.6 kg)  07/18/21 201 lb 6.4 oz (91.4 kg)  06/05/20 207 lb 12.8 oz (94.3 kg)     GEN:  Well nourished, well developed in no acute distress HEENT: Normal NECK: No JVD; No carotid bruits LYMPHATICS: No lymphadenopathy CARDIAC: RRR, no murmurs, rubs, gallops RESPIRATORY:  Clear to auscultation without rales, wheezing or rhonchi  ABDOMEN: Soft, non-tender, non-distended MUSCULOSKELETAL:  No edema; No deformity  SKIN: Warm and dry NEUROLOGIC:  Alert and oriented x 3 PSYCHIATRIC:  Normal affect   ASSESSMENT:    1. Chest pain, unspecified type   2. Essential  hypertension   3. Iron deficiency anemia, unspecified iron deficiency anemia type    PLAN:    Chest pain: Description concerning for typical angina as he describes substernal chest pressure with exertion.  May have been related to his anemia, as he reports improvement since his hemoglobin has improved since starting IV iron. -Recommend coronary CTA for further evaluation.  Will give Lopressor 100 mg prior to study -Echocardiogram  Hypertension: BP elevated but just switched to losartan 100 mg daily this week.  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  He has been receiving IV iron, most recent hemoglobin up to 13.9 on 09/23/2021.  RTC in 6 months   Medication Adjustments/Labs and Tests Ordered: Current medicines are reviewed at length with the patient today.  Concerns regarding medicines are outlined above.  Orders Placed This Encounter  Procedures   CT CORONARY MORPH W/CTA COR W/SCORE W/CA W/CM &/OR WO/CM   Basic metabolic panel   EKG 12-Lead   ECHOCARDIOGRAM COMPLETE   Meds ordered this encounter  Medications   metoprolol tartrate (LOPRESSOR) 100 MG tablet    Sig: Take 100 mg 2 hours before cardiac CT    Dispense:  1 tablet    Refill:  0    Patient Instructions  Medication Instructions:   Take Lopressor 100 mg 2 hours before cardiac CT     All other medications stay the same.   Labwork: BMET 1 week before CT   Testing/Procedures: Your physician has requested that you have an echocardiogram. Echocardiography is a painless test that uses sound waves to create images of your heart. It provides your doctor with information about the size and shape of your heart and how well your heart's chambers and valves are working. This procedure takes approximately one hour. There are no restrictions for this procedure.   Your physician has requested that  you have cardiac CT. Cardiac computed tomography (CT) is a painless test that uses an x-ray machine to take clear, detailed pictures of your heart. For further information please visit [email or web address] Please follow instruction sheet as given.    Follow-Up: 6 months with MD  Any Other Special Instructions Will Be Listed Below (If Applicable).     If you need a refill on your cardiac medications before your next appointment, please call your pharmacy.      Thank you for choosing [facility] !         Signed, Little Ishikawa, MD  10/03/2021 10:56 PM    [facility]

## 2021-10-03 ENCOUNTER — Encounter: Payer: Self-pay | Admitting: Cardiology

## 2021-10-03 ENCOUNTER — Other Ambulatory Visit (HOSPITAL_COMMUNITY)
Admission: RE | Admit: 2021-10-03 | Discharge: 2021-10-03 | Disposition: A | Discharge status: Home / Self Care | Payer: Commercial Managed Care - PPO | Source: Ambulatory Visit | Attending: Cardiology | Admitting: Cardiology

## 2021-10-03 ENCOUNTER — Ambulatory Visit (INDEPENDENT_AMBULATORY_CARE_PROVIDER_SITE_OTHER): Payer: Commercial Managed Care - PPO | Admitting: Cardiology

## 2021-10-03 VITALS — BP 160/90 | HR 87 | Ht 74.0 in | Wt 206.4 lb

## 2021-10-03 DIAGNOSIS — D509 Iron deficiency anemia, unspecified: Secondary | ICD-10-CM

## 2021-10-03 DIAGNOSIS — R079 Chest pain, unspecified: Secondary | ICD-10-CM

## 2021-10-03 DIAGNOSIS — I10 Essential (primary) hypertension: Secondary | ICD-10-CM | POA: Diagnosis not present

## 2021-10-03 LAB — BASIC METABOLIC PANEL
Anion gap: 9 (ref 5–15)
BUN: 17 mg/dL (ref 6–20)
CO2: 27 mmol/L (ref 22–32)
Calcium: 9.5 mg/dL (ref 8.9–10.3)
Chloride: 103 mmol/L (ref 98–111)
Creatinine, Ser: 0.97 mg/dL (ref 0.61–1.24)
GFR, Estimated: >60 mL/min (ref >60)
Glucose, Bld: 102 mg/dL — ABNORMAL HIGH (ref 70–99)
Potassium: 4.9 mmol/L (ref 3.5–5.1)
Sodium: 139 mmol/L (ref 135–145)

## 2021-10-03 MED ORDER — METOPROLOL TARTRATE 100 MG PO TABS: ORAL_TABLET | ORAL | 0 refills | Status: DC

## 2021-10-03 NOTE — Patient Instructions (Signed)
Medication Instructions:   Take Lopressor 100 mg 2 hours before cardiac CT     All other medications stay the same.   Labwork: BMET 1 week before CT   Testing/Procedures: Your physician has requested that you have an echocardiogram. Echocardiography is a painless test that uses sound waves to create images of your heart. It provides your doctor with information about the size and shape of your heart and how well your heart's chambers and valves are working. This procedure takes approximately one hour. There are no restrictions for this procedure.   Your physician has requested that you have cardiac CT. Cardiac computed tomography (CT) is a painless test that uses an x-ray machine to take clear, detailed pictures of your heart. For further information please visit [email or web address] Please follow instruction sheet as given.    Follow-Up: 6 months with MD  Any Other Special Instructions Will Be Listed Below (If Applicable).     If you need a refill on your cardiac medications before your next appointment, please call your pharmacy.      Thank you for choosing [facility] !

## 2021-10-14 ENCOUNTER — Encounter: Payer: Self-pay | Admitting: *Deleted

## 2021-10-15 ENCOUNTER — Other Ambulatory Visit: Payer: Self-pay

## 2021-10-15 ENCOUNTER — Telehealth (HOSPITAL_COMMUNITY): Payer: Self-pay | Admitting: *Deleted

## 2021-10-15 ENCOUNTER — Encounter (HOSPITAL_COMMUNITY): Payer: Self-pay

## 2021-10-15 ENCOUNTER — Inpatient Hospital Stay (HOSPITAL_COMMUNITY): Payer: Commercial Managed Care - PPO

## 2021-10-15 VITALS — BP 106/66 | HR 66 | Temp 96.9°F | Resp 18

## 2021-10-15 DIAGNOSIS — D500 Iron deficiency anemia secondary to blood loss (chronic): Secondary | ICD-10-CM | POA: Diagnosis not present

## 2021-10-15 MED ORDER — HEPARIN SOD (PORK) LOCK FLUSH 100 UNIT/ML IV SOLN: 500.0000 [IU] | Freq: Once | INTRAVENOUS | Status: DC | PRN

## 2021-10-15 MED ORDER — ALTEPLASE 2 MG IJ SOLR: 2.0000 mg | Freq: Once | INTRAMUSCULAR | Status: DC | PRN

## 2021-10-15 MED ORDER — SODIUM CHLORIDE 0.9% FLUSH: 3.0000 mL | Freq: Once | INTRAVENOUS | Status: DC | PRN

## 2021-10-15 MED ORDER — SODIUM CHLORIDE 0.9 % IV SOLN
300.0000 mg | Freq: Once | INTRAVENOUS | Status: AC
  Administered 2021-10-15: 300 mg via INTRAVENOUS
  Filled 2021-10-15: qty 300

## 2021-10-15 MED ORDER — HEPARIN SOD (PORK) LOCK FLUSH 100 UNIT/ML IV SOLN: 250.0000 [IU] | Freq: Once | INTRAVENOUS | Status: DC | PRN

## 2021-10-15 MED ORDER — SODIUM CHLORIDE 0.9% FLUSH: 10.0000 mL | Freq: Once | INTRAVENOUS | Status: DC | PRN

## 2021-10-15 MED ORDER — CLONIDINE HCL 0.1 MG PO TABS
0.2000 mg | ORAL_TABLET | Freq: Once | ORAL | Status: AC
  Administered 2021-10-15: 0.2 mg via ORAL
  Filled 2021-10-15: qty 2

## 2021-10-15 MED ORDER — SODIUM CHLORIDE 0.9 % IV SOLN: Freq: Once | INTRAVENOUS | Status: AC

## 2021-10-15 NOTE — Progress Notes (Signed)
Blood pressure 210/85, Dr Kirtland Bouchard notified and clonidine 0.2 mg then okay for iron. Pt took Claritin and tylenol pre-medications at home.   Tolerated iron infusion without incidence today.  Stable during and after infusion.  AVS reviewed.  Vital signs stable prior to discharge. Discharged in stable condition ambulatory.

## 2021-10-15 NOTE — Patient Instructions (Signed)
[facility]  Discharge Instructions: °Thank you for choosing [facility] to provide your oncology and hematology care.  °If you have a lab appointment with the Cancer Center, please come in thru the Main Entrance and check in at the main information desk. ° °Wear comfortable clothing and clothing appropriate for easy access to any Portacath or PICC line.  ° °We strive to give you quality time with your provider. You may need to reschedule your appointment if you arrive late (15 or more minutes).  Arriving late affects you and other patients whose appointments are after yours.  Also, if you miss three or more appointments without notifying the office, you may be dismissed from the clinic at the provider’s discretion.    °  °For prescription refill requests, have your pharmacy contact our office and allow 72 hours for refills to be completed.   ° °Today you received the following chemotherapy and/or immunotherapy agents venofer 300 mg    °  °To help prevent nausea and vomiting after your treatment, we encourage you to take your nausea medication as directed. ° °BELOW ARE SYMPTOMS THAT SHOULD BE REPORTED IMMEDIATELY: °*FEVER GREATER THAN 100.4 F (38 °C) OR HIGHER °*CHILLS OR SWEATING °*NAUSEA AND VOMITING THAT IS NOT CONTROLLED WITH YOUR NAUSEA MEDICATION °*UNUSUAL SHORTNESS OF BREATH °*UNUSUAL BRUISING OR BLEEDING °*URINARY PROBLEMS (pain or burning when urinating, or frequent urination) °*BOWEL PROBLEMS (unusual diarrhea, constipation, pain near the anus) °TENDERNESS IN MOUTH AND THROAT WITH OR WITHOUT PRESENCE OF ULCERS (sore throat, sores in mouth, or a toothache) °UNUSUAL RASH, SWELLING OR PAIN  °UNUSUAL VAGINAL DISCHARGE OR ITCHING  ° °Items with * indicate a potential emergency and should be followed up as soon as possible or go to the Emergency Department if any problems should occur. ° °Please show the CHEMOTHERAPY ALERT CARD or IMMUNOTHERAPY ALERT CARD at check-in to the Emergency  Department and triage nurse. ° °Should you have questions after your visit or need to cancel or reschedule your appointment, please contact  [facility] [phone]  and follow the prompts.  Office hours are 8:00 a.m. to 4:30 p.m. Monday - Friday. Please note that voicemails left after 4:00 p.m. may not be returned until the following business day.  We are closed weekends and major holidays. You have access to a nurse at all times for urgent questions. Please call the main number to the clinic [phone] and follow the prompts. ° °For any non-urgent questions, you may also contact your provider using MyChart. We now offer e-Visits for anyone 18 and older to request care online for non-urgent symptoms. For details visit [email or web address]. °  °Also download the MyChart app! Go to the app store, search "MyChart", open the app, select , and log in with your MyChart username and password. ° °Due to Covid, a mask is required upon entering the hospital/clinic. If you do not have a mask, one will be given to you upon arrival. For doctor visits, patients may have 1 support person aged 18 or older with them. For treatment visits, patients cannot have anyone with them due to current Covid guidelines and our immunocompromised population.  °

## 2021-10-15 NOTE — Telephone Encounter (Signed)
Attempted to call patient regarding upcoming cardiac CT appointment. °Left message on voicemail with name and callback number ° °Hubbert Landrigan RN Navigator Cardiac Imaging °[facility] °[phone] Office °[phone] Cell ° °

## 2021-10-16 ENCOUNTER — Other Ambulatory Visit: Payer: Self-pay | Admitting: Cardiovascular Disease

## 2021-10-16 ENCOUNTER — Ambulatory Visit (HOSPITAL_COMMUNITY)
Admission: RE | Admit: 2021-10-16 | Discharge: 2021-10-16 | Disposition: A | Discharge status: Home / Self Care | Payer: Commercial Managed Care - PPO | Source: Ambulatory Visit | Attending: Cardiovascular Disease | Admitting: Cardiovascular Disease

## 2021-10-16 ENCOUNTER — Ambulatory Visit (HOSPITAL_COMMUNITY)
Admission: RE | Admit: 2021-10-16 | Discharge: 2021-10-16 | Disposition: A | Discharge status: Home / Self Care | Payer: Commercial Managed Care - PPO | Source: Ambulatory Visit | Attending: Cardiology | Admitting: Cardiology

## 2021-10-16 DIAGNOSIS — R931 Abnormal findings on diagnostic imaging of heart and coronary circulation: Secondary | ICD-10-CM

## 2021-10-16 DIAGNOSIS — I2510 Atherosclerotic heart disease of native coronary artery without angina pectoris: Secondary | ICD-10-CM | POA: Diagnosis not present

## 2021-10-16 DIAGNOSIS — R079 Chest pain, unspecified: Secondary | ICD-10-CM | POA: Insufficient documentation

## 2021-10-16 MED ORDER — NITROGLYCERIN 0.4 MG SL SUBL
0.8000 mg | SUBLINGUAL_TABLET | Freq: Once | SUBLINGUAL | Status: AC
  Administered 2021-10-16: 0.8 mg via SUBLINGUAL

## 2021-10-16 MED ORDER — IOHEXOL 350 MG/ML SOLN
95.0000 mL | Freq: Once | INTRAVENOUS | Status: AC | PRN
  Administered 2021-10-16: 95 mL via INTRAVENOUS

## 2021-10-16 MED ORDER — NITROGLYCERIN 0.4 MG SL SUBL
SUBLINGUAL_TABLET | SUBLINGUAL | Status: AC
  Filled 2021-10-16: qty 2

## 2021-10-17 ENCOUNTER — Other Ambulatory Visit: Payer: Self-pay | Admitting: *Deleted

## 2021-10-17 ENCOUNTER — Encounter: Payer: Self-pay | Admitting: Cardiology

## 2021-10-17 MED ORDER — ROSUVASTATIN CALCIUM 10 MG PO TABS: 10.0000 mg | ORAL_TABLET | Freq: Every day | ORAL | 3 refills | Status: DC

## 2021-10-17 MED ORDER — ASPIRIN EC 81 MG PO TBEC: 81.0000 mg | DELAYED_RELEASE_TABLET | Freq: Every day | ORAL | 3 refills | Status: DC

## 2021-11-03 ENCOUNTER — Ambulatory Visit (INDEPENDENT_AMBULATORY_CARE_PROVIDER_SITE_OTHER): Payer: Commercial Managed Care - PPO

## 2021-11-03 DIAGNOSIS — R079 Chest pain, unspecified: Secondary | ICD-10-CM

## 2021-11-03 LAB — ECHOCARDIOGRAM COMPLETE: Area-P 1/2: 2.87 cm2

## 2021-11-14 ENCOUNTER — Other Ambulatory Visit (HOSPITAL_COMMUNITY): Payer: Commercial Managed Care - PPO

## 2021-11-18 ENCOUNTER — Encounter (HOSPITAL_COMMUNITY): Payer: Self-pay | Admitting: Hematology

## 2021-11-25 ENCOUNTER — Inpatient Hospital Stay (HOSPITAL_COMMUNITY): Payer: Self-pay

## 2021-11-26 ENCOUNTER — Other Ambulatory Visit: Payer: Self-pay

## 2021-11-26 ENCOUNTER — Inpatient Hospital Stay (HOSPITAL_COMMUNITY): Payer: BC Managed Care – PPO | Attending: Hematology

## 2021-11-26 ENCOUNTER — Encounter (HOSPITAL_COMMUNITY): Payer: Self-pay | Admitting: Hematology

## 2021-11-26 DIAGNOSIS — E538 Deficiency of other specified B group vitamins: Secondary | ICD-10-CM | POA: Insufficient documentation

## 2021-11-26 DIAGNOSIS — D500 Iron deficiency anemia secondary to blood loss (chronic): Secondary | ICD-10-CM | POA: Diagnosis not present

## 2021-11-26 DIAGNOSIS — Z79899 Other long term (current) drug therapy: Secondary | ICD-10-CM | POA: Diagnosis not present

## 2021-11-26 DIAGNOSIS — E559 Vitamin D deficiency, unspecified: Secondary | ICD-10-CM | POA: Insufficient documentation

## 2021-11-26 DIAGNOSIS — K5900 Constipation, unspecified: Secondary | ICD-10-CM | POA: Diagnosis not present

## 2021-11-26 DIAGNOSIS — K922 Gastrointestinal hemorrhage, unspecified: Secondary | ICD-10-CM | POA: Diagnosis present

## 2021-11-26 LAB — CBC WITH DIFFERENTIAL/PLATELET
Abs Immature Granulocytes: 0.02 10*3/uL (ref 0.00–0.07)
Basophils Absolute: 0.1 10*3/uL (ref 0.0–0.1)
Basophils Relative: 2 %
Eosinophils Absolute: 0.2 10*3/uL (ref 0.0–0.5)
Eosinophils Relative: 6 %
HCT: 48.6 % (ref 39.0–52.0)
Hemoglobin: 15.4 g/dL (ref 13.0–17.0)
Immature Granulocytes: 1 %
Lymphocytes Relative: 35 %
Lymphs Abs: 1.2 10*3/uL (ref 0.7–4.0)
MCH: 28.6 pg (ref 26.0–34.0)
MCHC: 31.7 g/dL (ref 30.0–36.0)
MCV: 90.3 fL (ref 80.0–100.0)
Monocytes Absolute: 0.2 10*3/uL (ref 0.1–1.0)
Monocytes Relative: 7 %
Neutro Abs: 1.8 10*3/uL (ref 1.7–7.7)
Neutrophils Relative %: 49 %
Platelets: 256 10*3/uL (ref 150–400)
RBC: 5.38 MIL/uL (ref 4.22–5.81)
RDW: 14.9 % (ref 11.5–15.5)
WBC: 3.6 10*3/uL — ABNORMAL LOW (ref 4.0–10.5)
nRBC: 0 % (ref 0.0–0.2)

## 2021-11-26 LAB — IRON AND TIBC
Iron: 64 ug/dL (ref 45–182)
Saturation Ratios: 13 % — ABNORMAL LOW (ref 17.9–39.5)
TIBC: 501 ug/dL — ABNORMAL HIGH (ref 250–450)
UIBC: 437 ug/dL

## 2021-11-26 LAB — VITAMIN B12: Vitamin B-12: 324 pg/mL (ref 180–914)

## 2021-11-26 LAB — FERRITIN: Ferritin: 39 ng/mL (ref 24–336)

## 2021-11-26 LAB — VITAMIN D 25 HYDROXY (VIT D DEFICIENCY, FRACTURES): Vit D, 25-Hydroxy: 25.07 ng/mL — ABNORMAL LOW (ref 30–100)

## 2021-11-27 ENCOUNTER — Telehealth (HOSPITAL_COMMUNITY): Payer: Commercial Managed Care - PPO | Admitting: Physician Assistant

## 2021-11-27 NOTE — Progress Notes (Signed)
Virtual Visit via Telephone Note [facility]  I connected with Troy [name]  on 11/28/21 at  9:27 AM by telephone and verified that I am speaking with the correct person using two identifiers.  Location: Patient: Home Provider: [facility]   I discussed the limitations, risks, security and privacy concerns of performing an evaluation and management service by telephone and the availability of in person appointments. I also discussed with the patient that there may be a patient responsible charge related to this service. The patient expressed understanding and agreed to proceed.   HISTORY OF PRESENT ILLNESS: [name] 61 y.o. 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  He was last evaluated via telemedicine visit with Rojelio Brenner PA-C on 09/24/2021.  He received IV iron with Venofer 300 mg x 3 doses from 09/25/2021 through 10/15/2021. After IV iron, he reports that his symptoms were improved, with better energy and resolution of his pica, leg cramps, and dyspnea on exertion.  He continues to have chest pain, but reports that he was worked up by cardiologist and this was found to be due to a large hiatal hernia.  He denies any overt signs of GI blood loss such as melena, hematochezia, or hemoptysis.   He noted some scant rectal bleeding after straining for a bowel movement.    He still takes ibuprofen occasionally, although he was warned at previous visits that this could exacerbate stomach irritation and bleeding.  He has not been taking his iron supplementation due to side effects of constipation.  He has not taken his vitamin D for the past month, since he did not get the medication refilled.  He has not been taking his vitamin B12.  Energy today is  100%, with appetite  100%.  He reports that he is maintaining a stable weight at this time.     OBSERVATIONS/OBJECTIVE: Review of Systems  Constitutional:  Negative for chills, diaphoresis, fever, malaise/fatigue and weight loss.  Respiratory:  Negative for cough and shortness of breath.   Cardiovascular:  Positive for chest pain (hiatal hernia). Negative for palpitations.  Gastrointestinal:  Negative for abdominal pain, blood in stool, melena, nausea and vomiting.  Neurological:  Negative for dizziness and headaches.  Psychiatric/Behavioral:  The patient has insomnia.     PHYSICAL EXAM (per limitations of virtual telephone visit): The patient is alert and oriented x 3, exhibiting adequate mentation, good mood, and ability to speak in full sentences and execute sound judgement.   ASSESSMENT & PLAN: 1.  Iron deficiency anemia: - Secondary to chronic GI blood loss - Last EGD and colonoscopy on 12/01/2016 showed AVMs and Cameron erosions - Most recent IV iron: Venofer 300 mg x 3 doses (09/25/2021-10/15/2021) - He has not been taking iron pill due to constipation - Symptoms have resolved after most recent IV iron.  Reports resolution of fatigue, dyspnea on exertion, pica, and leg cramps. - He denies any overt signs of blood loss such as bright red blood per rectum or melena .   - Most recent labs (11/26/2021): Hgb 15.4/MCV 90.3, ferritin 39, iron saturation 13% with TIBC 501 - CT coronary imaging (10/16/2021) shows very large hiatal hernia, which I suspect is contributing to chronic GI blood loss. - PLAN: Anemia has resolved, patient has persistent iron deficiency.  He does not want IV iron at this time since he is currently asymptomatic.  Discussed  with patient that he still needs iron repletion, and he has opted to try oral iron supplementation again.  Instructed patient to take ferrous sulfate 325 mg daily with stool softener. - We will repeat iron panel and CBC in 3 months. - He will call us back if he develops any symptoms of severe iron deficiency.   - Patient was once again advised  to avoid NSAIDs/ibuprofen.  - Referral sent to general surgery to discuss surgical correction of large hiatal hernia, which is likely contributing to his chronic GI blood loss. - Phone visit in 3 months.  2.  Vitamin D deficiency: - Patient is supposed to be taking vitamin D 50,000 units weekly, but he has not been taking it for the past month - Most recent vitamin D (11/26/2021) is low at 25.07 - PLAN: Refill for vitamin D sent to pharmacy.  Patient instructed to take as prescribed.  3.  Vitamin B12 deficiency: - Most recent B12 (11/26/2021) marginal at 324, methylmalonic acid is pending - He reports that he has not been taking his B12 at home   - PLAN: Encouraged patient to continue vitamin B12 (cyanocobalamin) 1000 mcg tablet daily.  We will recheck B12 and methylmalonic acid in 3 to 6 months.   FOLLOW UP INSTRUCTIONS: Labs in 3 months Phone visit after labs Referral to Dr. Lovell Sheehan for hiatal hernia repair    I discussed the assessment and treatment plan with the patient. The patient was provided an opportunity to ask questions and all were answered. The patient agreed with the plan and demonstrated an understanding of the instructions.   The patient was advised to call back or seek an in-person evaluation if the symptoms worsen or if the condition fails to improve as anticipated.  I provided 21 minutes of non-face-to-face time during this encounter.   Carnella Guadalajara, PA-C 11/28/2021 10:10 AM

## 2021-11-28 ENCOUNTER — Other Ambulatory Visit: Payer: Self-pay

## 2021-11-28 ENCOUNTER — Inpatient Hospital Stay (HOSPITAL_BASED_OUTPATIENT_CLINIC_OR_DEPARTMENT_OTHER): Payer: BC Managed Care – PPO | Admitting: Physician Assistant

## 2021-11-28 DIAGNOSIS — E559 Vitamin D deficiency, unspecified: Secondary | ICD-10-CM

## 2021-11-28 DIAGNOSIS — E538 Deficiency of other specified B group vitamins: Secondary | ICD-10-CM

## 2021-11-28 DIAGNOSIS — K449 Diaphragmatic hernia without obstruction or gangrene: Secondary | ICD-10-CM

## 2021-11-28 DIAGNOSIS — D500 Iron deficiency anemia secondary to blood loss (chronic): Secondary | ICD-10-CM

## 2021-11-28 MED ORDER — VITAMIN D (ERGOCALCIFEROL) 1.25 MG (50000 UNIT) PO CAPS: ORAL_CAPSULE | ORAL | 11 refills | Status: DC

## 2021-11-29 LAB — METHYLMALONIC ACID, SERUM: Methylmalonic Acid, Quantitative: 169 nmol/L (ref 0–378)

## 2021-12-22 ENCOUNTER — Telehealth: Payer: Self-pay

## 2021-12-22 ENCOUNTER — Other Ambulatory Visit: Payer: Self-pay

## 2021-12-22 ENCOUNTER — Encounter: Payer: Self-pay | Admitting: Surgery

## 2021-12-22 ENCOUNTER — Telehealth: Payer: Self-pay | Admitting: Surgery

## 2021-12-22 ENCOUNTER — Ambulatory Visit (INDEPENDENT_AMBULATORY_CARE_PROVIDER_SITE_OTHER): Payer: BC Managed Care – PPO | Admitting: Surgery

## 2021-12-22 VITALS — BP 190/109 | HR 78 | Temp 98.9°F | Ht 74.0 in | Wt 202.4 lb

## 2021-12-22 DIAGNOSIS — K648 Other hemorrhoids: Secondary | ICD-10-CM | POA: Diagnosis not present

## 2021-12-22 DIAGNOSIS — K644 Residual hemorrhoidal skin tags: Secondary | ICD-10-CM

## 2021-12-22 DIAGNOSIS — K449 Diaphragmatic hernia without obstruction or gangrene: Secondary | ICD-10-CM | POA: Diagnosis not present

## 2021-12-22 NOTE — Telephone Encounter (Signed)
Called patient but had to leave him a detailed message letting him know that he has a new patient appointment with Dr. Tobi Bastos on 12/30/2021 at 2:15 PM per Dr. Tobi Bastos.

## 2021-12-22 NOTE — Telephone Encounter (Signed)
Inbound call from patient, returning call from the office.  Troy [name] was educated that he will need to swing [facility] across the street from our office to pick up the CT prep-lit (contrast) prior to his imaging appointment.  He states he has an appt w/the throat doctor on 2/14 & will pick it up while in the area.  Thank you

## 2021-12-22 NOTE — Patient Instructions (Addendum)
CT scheduled Abdomen/Pelvis @ [facility] 01/06/22 @ 9:15 arrival. Nothing to eat /drink 4 hours prior. You will need to pick up the prep kit for the CT scan today.  Barium swallow study scheduled 01/14/2022 @ [place] [facility] @ 9am. Do not eat/drink 4 hours prior.  Referral sent to [facility]. Someone from their office will call to schedule an appointment .   Please call their office if you have not heard from anyone within a few days. [phone].

## 2021-12-23 ENCOUNTER — Telehealth: Payer: Self-pay

## 2021-12-23 NOTE — Telephone Encounter (Signed)
CALLED PATIENT NO ANSWER LEFT VOICEMAIL FOR A CALL BACK ? ?

## 2021-12-24 ENCOUNTER — Telehealth: Payer: Self-pay

## 2021-12-24 NOTE — Progress Notes (Signed)
Patient ID: Troy [name], male   DOB: 08/28/1961, 61 y.o.   MRN: [id number]  HPI Troy [name] is a 61 y.o. male seen in consultation at the request of Ms. Pennington PA-C.  Initially has been seen hematology due to chronic anemia and he gets regular IV iron infusions.  He denies any overt signs of GI blood loss such as melena, hematochezia, or hemoptysis.  He noted some scant rectal bleeding after straining for a bowel movement He does endorse some chronic fatigue. Has some atypical chest pain and he has prompted cardio work-up to include coronary angiogram.  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  Further cardiology work-up has been negative so far. Did have a CT scan that I personally reviewed more than 5 years ago showing evidence of left inguinal hernia and a smaller paraesophageal hernia compared to the CT coronary angiography [facility] shows a hemoglobin of 13 normal white count.  BMP was normal. He also endorses symptomatic hemorrhoids that sometimes have some occasional bleeding itching and discomfort.  He wishes to have hemorrhoids addressed potentially due to same operative time as paraesophageal hernia. He is accompanied by the wife    HPI  Past Medical History:  Diagnosis Date   Alcohol dependence (HCC)    Anxiety    Chest pain    GERD (gastroesophageal reflux disease)    Hypertension    Insomnia    Iron deficiency anemia 11/25/2016   Plantar fascial fibromatosis    Psoriasis     Past Surgical History:  Procedure Laterality Date   BIOPSY  12/01/2016   Procedure: BIOPSY;  Surgeon: West Bali, MD;  [facility];  Service: Endoscopy;;  gastric duodenal hiatal hernia pouch   COLONOSCOPY WITH PROPOFOL N/A 12/01/2016   Procedure: COLONOSCOPY WITH PROPOFOL;  Surgeon: West Bali, MD;  [facility]: [facility];  Service: Endoscopy;  Laterality: N/A;  1:45 pm   ESOPHAGOGASTRODUODENOSCOPY  2004   Dr. 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, unknown path    ESOPHAGOGASTRODUODENOSCOPY (EGD) WITH PROPOFOL N/A 12/01/2016   Procedure: ESOPHAGOGASTRODUODENOSCOPY (EGD) WITH PROPOFOL;  Surgeon: West Bali, MD;  [facility];  Service: Endoscopy;  Laterality: N/A;   GIVENS CAPSULE STUDY N/A 12/23/2016   Procedure: GIVENS CAPSULE STUDY;  Surgeon: West Bali, MD;  [facility];  Service: Endoscopy;  Laterality: N/A;  arrive at 7:00am for 7:30am appt time   HEMORRHOID SURGERY     residual skin tags   right ankle     UMBILICAL HERNIA REPAIR N/A 06/07/2014   Procedure: HERNIA REPAIR UMBILICAL ADULT;  Surgeon: Marlane Hatcher, MD;  Location: [facility];  Service: General;  Laterality: N/A;    Family History  Problem Relation Age of Onset   Heart disease Mother        stent placement   Heart attack Father        died from MR age 66   Heart attack Maternal Grandmother    Colon cancer Paternal Grandfather     Social History Social History   Tobacco Use   Smoking status: Never   Smokeless tobacco: Never  Vaping Use   Vaping Use: Never used  Substance Use Topics   Alcohol use: Yes    Comment: 6-12 beers at a time sometimes daily.    Drug use: No    No Known Allergies  Current Outpatient Medications  Medication Sig Dispense Refill  Cyanocobalamin (B-12) 5000 MCG SUBL Place 5,000 mcg under the tongue daily. 30 tablet 6   metoprolol tartrate (LOPRESSOR) 100 MG tablet Take 100 mg 2 hours before cardiac CT 1 tablet 0   sildenafil (VIAGRA) 100 MG tablet SMARTSIG:1 Tablet(s) By Mouth     Vitamin D, Ergocalciferol, (DRISDOL) 1.25 MG (50000 UNIT) CAPS capsule TAKE ONE CAPSULE (50,000 UNITS TOTAL) BYMOUTH ONCE A WEEK 4 capsule 11   No current facility-administered medications for this visit.     Review of Systems Full ROS  was asked and was negative except for the information on the HPI  Physical Exam Blood pressure (!) 190/109, pulse 78, temperature 98.9 F (37.2  C), temperature source Oral, height 6\' 2"  (1.88 m), weight 202 lb 6.4 oz (91.8 kg), SpO2 96 %. CONSTITUTIONAL: NAD. EYES: Pupils are equal, round, , Sclera are non-icteric. EARS, NOSE, MOUTH AND THROAT: He is wearing a mask hearing is intact to voice. LYMPH NODES:  Lymph nodes in the neck are normal. RESPIRATORY:  Lungs are clear. There is normal respiratory effort, with equal breath sounds bilaterally, and without pathologic use of accessory muscles. CARDIOVASCULAR: Heart is regular without murmurs, gallops, or rubs. GI: The abdomen is  soft, nontender, and nondistended. There are no palpable masses. There is no hepatosplenomegaly. There are normal bowel sounds in all quadrants.  Rectal: Evidence of external/internal hemorrhoids on right and left posterolateral aspects.  No rectal masses MUSCULOSKELETAL: Normal muscle strength and tone. No cyanosis or edema.   SKIN: Turgor is good and there are no pathologic skin lesions or ulcers. NEUROLOGIC: Motor and sensation is grossly normal. Cranial nerves are grossly intact. PSYCH:  Oriented to person, place and time. Affect is normal.  Data Reviewed  I have personally reviewed the patient's imaging, laboratory findings and medical records.    Assessment/Plan 61 year old male with large paraesophageal hernia type III with significant symptoms.  Discussed with the patient in detail about his disease process I do recommend further work-up in the form of EGD, CT scan of the abdomen and pelvis to evaluate for the mediastinal and abdominal anatomy as well as a barium swallow..  I do think that paraesophageal hernia was contributing to the anemia as well He was also interested in removing some of the external hemorrhoids that seem to be having a lot of issues.  Discussed with him that it is feasible to do 2 surgeries in 1 operative setting but he will incur more pain. We will obtain appropriate work-up and reevaluate him again.  I do think that he will be a  good candidate for robotic repair. A copy of this report was sent to the referring provider  Caroleen Hamman, MD FACS General Surgeon 12/24/2021, 5:56 PM

## 2021-12-24 NOTE — Telephone Encounter (Signed)
CALLED PATIENT NO ANSWER LEFT VOICEMAIL FOR A CALL BACK °Letter sent °

## 2021-12-30 ENCOUNTER — Other Ambulatory Visit: Payer: Self-pay

## 2021-12-30 ENCOUNTER — Encounter: Payer: Self-pay | Admitting: Gastroenterology

## 2021-12-30 ENCOUNTER — Ambulatory Visit (INDEPENDENT_AMBULATORY_CARE_PROVIDER_SITE_OTHER): Payer: BC Managed Care – PPO | Admitting: Gastroenterology

## 2021-12-30 VITALS — BP 210/124 | HR 71 | Temp 98.7°F | Wt 205.8 lb

## 2021-12-30 DIAGNOSIS — K449 Diaphragmatic hernia without obstruction or gangrene: Secondary | ICD-10-CM

## 2021-12-30 DIAGNOSIS — K648 Other hemorrhoids: Secondary | ICD-10-CM

## 2021-12-30 DIAGNOSIS — K644 Residual hemorrhoidal skin tags: Secondary | ICD-10-CM | POA: Insufficient documentation

## 2021-12-30 NOTE — Progress Notes (Signed)
Wyline Mood MD, MRCP(U.K) [address]  [address]  [place], [place] [address]  Main: [phone]  Fax: [phone]   Gastroenterology Consultation  Referring Provider:   Dr Everlene Farrier Primary Care Physician:  Gareth Morgan, MD Primary Gastroenterologist:  Dr. Wyline Mood  Reason for Consultation:     EGD prior to repair of hiatal hernia.        HPI:   Troy [name] is a 61 y.o. y/o male referred for  and EGD to be done prior to repair of a hiatal hernia. He states that he has had issues with acid reflux ,.  He Also States He Has Issues with Hemorrhoids and thinks it is external hemorrhoids would like to avoid surgery if possible. 2018: EGD showed large hiatal hernia.   Past Medical History:  Diagnosis Date   Alcohol dependence (HCC)    Anxiety    Chest pain    GERD (gastroesophageal reflux disease)    Hypertension    Insomnia    Iron deficiency anemia 11/25/2016   Plantar fascial fibromatosis    Psoriasis     Past Surgical History:  Procedure Laterality Date   BIOPSY  12/01/2016   Procedure: BIOPSY;  Surgeon: West Bali, MD;  [facility];  Service: Endoscopy;;  gastric duodenal hiatal hernia pouch   COLONOSCOPY WITH PROPOFOL N/A 12/01/2016   Procedure: COLONOSCOPY WITH PROPOFOL;  Surgeon: West Bali, MD;  [facility];  Service: Endoscopy;  Laterality: N/A;  1:45 pm   ESOPHAGOGASTRODUODENOSCOPY  2004   Dr. 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, unknown path    ESOPHAGOGASTRODUODENOSCOPY (EGD) WITH PROPOFOL N/A 12/01/2016   Procedure: ESOPHAGOGASTRODUODENOSCOPY (EGD) WITH PROPOFOL;  Surgeon: West Bali, MD;  [facility];  Service: Endoscopy;  Laterality: N/A;   GIVENS CAPSULE STUDY N/A 12/23/2016   Procedure: GIVENS CAPSULE STUDY;  Surgeon: West Bali, MD;  [facility];  Service: Endoscopy;  Laterality: N/A;  arrive at 7:00am for 7:30am appt time   HEMORRHOID SURGERY     residual skin tags   right  ankle     UMBILICAL HERNIA REPAIR N/A 06/07/2014   Procedure: HERNIA REPAIR UMBILICAL ADULT;  Surgeon: Marlane Hatcher, MD;  Location: [facility];  Service: General;  Laterality: N/A;    Prior to Admission medications   Medication Sig Start Date End Date Taking? Authorizing Provider  Cyanocobalamin (B-12) 5000 MCG SUBL Place 5,000 mcg under the tongue daily. 03/18/17   Ellouise Newer, PA-C  metoprolol tartrate (LOPRESSOR) 100 MG tablet Take 100 mg 2 hours before cardiac CT 10/03/21   Little Ishikawa, MD  sildenafil (VIAGRA) 100 MG tablet SMARTSIG:1 Tablet(s) By Mouth 09/30/21   [provider]  Vitamin D, Ergocalciferol, (DRISDOL) 1.25 MG (50000 UNIT) CAPS capsule TAKE ONE CAPSULE (50,000 UNITS TOTAL) BYMOUTH ONCE A WEEK 11/28/21   Carnella Guadalajara, PA-C    Family History  Problem Relation Age of Onset   Heart disease Mother        stent placement   Heart attack Father        died from MR age 3   Heart attack Maternal Grandmother    Colon cancer Paternal Grandfather      Social History   Tobacco Use   Smoking status: Never   Smokeless tobacco: Never  Vaping Use   Vaping Use: Never used  Substance Use Topics   Alcohol use: Yes    Comment: 6-12  beers at a time sometimes daily.    Drug use: No    Allergies as of 12/30/2021   (No Known Allergies)    Review of Systems:    All systems reviewed and negative except where noted in HPI.   Physical Exam:  There were no vitals taken for this visit. No LMP for male patient. Psych:  Alert and cooperative. Normal mood and affect. General:   Alert,  Well-developed, well-nourished, pleasant and cooperative in NAD Head:  Normocephalic and atraumatic. Eyes:  Sclera clear, no icterus.   Conjunctiva pink. Ears:  Normal auditory acuity. Neurologic:  Alert and oriented x3;  grossly normal neurologically. Psych:  Alert and cooperative. Normal mood and affect.   Rectal exam: Chaperone present anoscopy performed.   Extent of skin tags noted on anoscopy large second-degree prolapsing hemorrhoids noted. Imaging Studies: No results found.   PROCEDURE NOTE: The patient presents with symptomatic grade 2 hemorrhoids, unresponsive to maximal medical therapy, requesting rubber band ligation of his/her hemorrhoidal disease.  All risks, benefits and alternative forms of therapy were described and informed consent was obtained.  In the Left Lateral Decubitus position (if anoscopy is performed) anoscopic examination revealed grade 2 hemorrhoids in the all position(s).   The decision was made to band the LL internal hemorrhoid, and the [facility] ORegan System was used to perform band ligation without complication.  Digital anorectal examination was then performed to assure proper positioning of the band, and to adjust the banded tissue as required.  The patient was discharged home without pain or other issues.  Dietary and behavioral recommendations were given and (if necessary - prescriptions were given), along with follow-up instructions.  The patient will return 4 weeks for follow-up and possible additional banding as required.  No complications were encountered and the patient tolerated the procedure well.    Assessment and Plan:   Troy [name] is a 61 y.o. y/o male has been referred for an EGD prior to repair of a large paraesophageal hernia by Dr Everlene Farrier.  He mentioned that he was having symptomatic hemorrhoids and he discussed about surgery but he was concerned about the pain.  I examined him and he definitely has skin tags and prolapsing second-degree internal hemorrhoids.  Discussed about treatment of hemorrhoids which is banding and I clearly explained to him that the skin tags would still persist but will treat the hemorrhoids which has failed conservative management.  He decided to proceed and we banded the left lateral column.  Plan  EGD tomorrow  Repeat banding of internal hemorrhoids in 4 weeks time.  I have  explained to him that the skin tags cannot be dealt with banding and would require surgery if he decides to have them treated  I have discussed alternative options, risks & benefits,  which include, but are not limited to, bleeding, infection, perforation,respiratory complication & drug reaction.  The patient agrees with this plan & written consent will be obtained.     Follow up in 4 weeks  Dr Wyline Mood MD,MRCP(U.K)

## 2021-12-31 ENCOUNTER — Ambulatory Visit
Admission: RE | Admit: 2021-12-31 | Discharge: 2021-12-31 | Disposition: A | Discharge status: Home / Self Care | Payer: BC Managed Care – PPO | Attending: Gastroenterology | Admitting: Gastroenterology

## 2021-12-31 ENCOUNTER — Encounter: Payer: Self-pay | Admitting: Gastroenterology

## 2021-12-31 ENCOUNTER — Ambulatory Visit: Payer: BC Managed Care – PPO | Admitting: Anesthesiology

## 2021-12-31 ENCOUNTER — Other Ambulatory Visit: Payer: Self-pay

## 2021-12-31 ENCOUNTER — Encounter
Admission: RE | Disposition: A | Discharge status: Home / Self Care | Payer: Self-pay | Source: Home / Self Care | Attending: Gastroenterology

## 2021-12-31 DIAGNOSIS — Z01818 Encounter for other preprocedural examination: Secondary | ICD-10-CM | POA: Insufficient documentation

## 2021-12-31 DIAGNOSIS — K2970 Gastritis, unspecified, without bleeding: Secondary | ICD-10-CM | POA: Insufficient documentation

## 2021-12-31 DIAGNOSIS — F419 Anxiety disorder, unspecified: Secondary | ICD-10-CM | POA: Diagnosis not present

## 2021-12-31 DIAGNOSIS — K219 Gastro-esophageal reflux disease without esophagitis: Secondary | ICD-10-CM | POA: Diagnosis not present

## 2021-12-31 DIAGNOSIS — I10 Essential (primary) hypertension: Secondary | ICD-10-CM | POA: Diagnosis not present

## 2021-12-31 DIAGNOSIS — K449 Diaphragmatic hernia without obstruction or gangrene: Secondary | ICD-10-CM | POA: Diagnosis not present

## 2021-12-31 DIAGNOSIS — K259 Gastric ulcer, unspecified as acute or chronic, without hemorrhage or perforation: Secondary | ICD-10-CM | POA: Insufficient documentation

## 2021-12-31 HISTORY — PX: ESOPHAGOGASTRODUODENOSCOPY (EGD) WITH PROPOFOL: SHX5813

## 2021-12-31 SURGERY — ESOPHAGOGASTRODUODENOSCOPY (EGD) WITH PROPOFOL
Anesthesia: General

## 2021-12-31 MED ORDER — PROPOFOL 10 MG/ML IV BOLUS
INTRAVENOUS | Status: DC | PRN
  Administered 2021-12-31: 150 mg via INTRAVENOUS

## 2021-12-31 MED ORDER — LABETALOL HCL 5 MG/ML IV SOLN
5.0000 mg | INTRAVENOUS | Status: DC | PRN
  Administered 2021-12-31: 5 mg via INTRAVENOUS
  Filled 2021-12-31 (×2): qty 4

## 2021-12-31 MED ORDER — LIDOCAINE HCL (CARDIAC) PF 100 MG/5ML IV SOSY
PREFILLED_SYRINGE | INTRAVENOUS | Status: DC | PRN
Start: 2021-12-31 — End: 2021-12-31
  Administered 2021-12-31: 100 mg via INTRAVENOUS

## 2021-12-31 MED ORDER — LABETALOL HCL 5 MG/ML IV SOLN
INTRAVENOUS | Status: DC | PRN
  Administered 2021-12-31: 5 mg via INTRAVENOUS

## 2021-12-31 MED ORDER — OMEPRAZOLE MAGNESIUM 20 MG PO TBEC: 40.0000 mg | DELAYED_RELEASE_TABLET | Freq: Two times a day (BID) | ORAL | 3 refills | Status: DC

## 2021-12-31 MED ORDER — SODIUM CHLORIDE 0.9 % IV SOLN: INTRAVENOUS | Status: DC

## 2021-12-31 MED ORDER — PROPOFOL 10 MG/ML IV BOLUS
INTRAVENOUS | Status: AC
  Filled 2021-12-31: qty 40

## 2021-12-31 NOTE — Anesthesia Preprocedure Evaluation (Signed)
Anesthesia Evaluation  Patient identified by MRN, date of birth, ID band Patient awake    Reviewed: Allergy & Precautions, H&P , NPO status , Patient's Chart, lab work & pertinent test results, reviewed documented beta blocker date and time   History of Anesthesia Complications Negative for: history of anesthetic complications  Airway Mallampati: II  TM Distance: >3 FB Neck ROM: full    Dental  (+) Dental Advidsory Given, Missing, Teeth Intact   Pulmonary neg pulmonary ROS,    Pulmonary exam normal breath sounds clear to auscultation       Cardiovascular Exercise Tolerance: Good hypertension, (-) angina(-) Past MI and (-) Cardiac Stents Normal cardiovascular exam(-) dysrhythmias (-) Valvular Problems/Murmurs Rhythm:regular Rate:Normal     Neuro/Psych PSYCHIATRIC DISORDERS Anxiety negative neurological ROS     GI/Hepatic Neg liver ROS, hiatal hernia, GERD  ,  Endo/Other  negative endocrine ROS  Renal/GU negative Renal ROS  negative genitourinary   Musculoskeletal   Abdominal   Peds  Hematology  (+) Blood dyscrasia, anemia ,   Anesthesia Other Findings Past Medical History: No date: Alcohol dependence ([place]) No date: Anxiety No date: Chest pain No date: GERD (gastroesophageal reflux disease) No date: Hypertension No date: Insomnia 11/25/2016: Iron deficiency anemia No date: Plantar fascial fibromatosis No date: Psoriasis   Reproductive/Obstetrics negative OB ROS                             Anesthesia Physical Anesthesia Plan  ASA: 2  Anesthesia Plan: General   Post-op Pain Management:    Induction: Intravenous  PONV Risk Score and Plan: 2 and Propofol infusion and TIVA  Airway Management Planned: Natural Airway and Nasal Cannula  Additional Equipment:   Intra-op Plan:   Post-operative Plan:   Informed Consent: I have reviewed the patients History and Physical, chart,  labs and discussed the procedure including the risks, benefits and alternatives for the proposed anesthesia with the patient or authorized representative who has indicated his/her understanding and acceptance.     Dental Advisory Given  Plan Discussed with: Anesthesiologist, CRNA and Surgeon  Anesthesia Plan Comments:         Anesthesia Quick Evaluation

## 2021-12-31 NOTE — Addendum Note (Signed)
Addendum  created 12/31/21 1335 by Tammi Klippel, CRNA   Intraprocedure Event edited

## 2021-12-31 NOTE — Op Note (Signed)
[facility] Patient Name: Troy [name] Procedure Date: 12/31/2021 11:26 AM MRN: [id number] Account #: [id number] Date of Birth: August 20, 1961 Admit Type: Outpatient Age: 61 Room: [facility] ENDO ROOM 3 Gender: Male Note Status: Finalized Instrument Name: Upper Endoscope [id number] Procedure:             Upper GI endoscopy Indications:           Preoperative assessment Providers:             Wyline Mood MD, MD Medicines:             Monitored Anesthesia Care Complications:         No immediate complications. Procedure:             Pre-Anesthesia Assessment:                        - Prior to the procedure, a History and Physical was                         performed, and patient medications, allergies and                         sensitivities were reviewed. The patient's tolerance                         of previous anesthesia was reviewed.                        - The risks and benefits of the procedure and the                         sedation options and risks were discussed with the                         patient. All questions were answered and informed                         consent was obtained.                        - ASA Grade Assessment: II - A patient with mild                         systemic disease.                        After obtaining informed consent, the endoscope was                         passed under direct vision. Throughout the procedure,                         the patient's blood pressure, pulse, and oxygen                         saturations were monitored continuously. The                         Endosonoscope was introduced through the mouth, and  advanced to the third part of duodenum. The upper GI                         endoscopy was accomplished with ease. The patient                         tolerated the procedure well. Findings:      The examined esophagus was normal.      The examined duodenum was  normal.      A large hiatal hernia was present.      Patchy moderate inflammation characterized by congestion (edema),       erosions, erythema and aphthous ulcerations was found in the gastric       antrum. Biopsies were taken with a cold forceps for histology.      The cardia and gastric fundus were normal on retroflexion. Impression:            - Normal esophagus.                        - Normal examined duodenum.                        - Large hiatal hernia.                        - Gastritis. Biopsied. Recommendation:        - Await pathology results.                        - Discharge patient to home (with escort).                        - Resume previous diet.                        - No ibuprofen, naproxen, or other non-steroidal                         anti-inflammatory drugs.                        - Use Prilosec (omeprazole) 40 mg PO BID.                        - Repeat upper endoscopy in 8 weeks to evaluate the                         response to therapy.                        - Return to GI clinic in 3 months. Procedure Code(s):     --- Professional ---                        [phone], Esophagogastroduodenoscopy, flexible,                         transoral; with biopsy, single or multiple Diagnosis Code(s):     --- Professional ---                        K44.9,  Diaphragmatic hernia without obstruction or                         gangrene                        K29.70, Gastritis, unspecified, without bleeding                        Z01.818, Encounter for other preprocedural examination CPT copyright 2019 American Medical Association. All rights reserved. The codes documented in this report are preliminary and upon coder review may  be revised to meet current compliance requirements. Wyline Mood, MD Wyline Mood MD, MD 12/31/2021 11:44:49 AM This report has been signed electronically. Number of Addenda: 0 Note Initiated On: 12/31/2021 11:26 AM Estimated Blood Loss:  Estimated  blood loss: none.      [facility] [place]

## 2021-12-31 NOTE — Anesthesia Postprocedure Evaluation (Signed)
Anesthesia Post Note  Patient: Troy [name]  Procedure(s) Performed: ESOPHAGOGASTRODUODENOSCOPY (EGD) WITH PROPOFOL  Patient location during evaluation: Endoscopy Anesthesia Type: General Level of consciousness: awake and alert Pain management: pain level controlled Vital Signs Assessment: post-procedure vital signs reviewed and stable Respiratory status: spontaneous breathing, nonlabored ventilation, respiratory function stable and patient connected to nasal cannula oxygen Cardiovascular status: blood pressure returned to baseline and stable Postop Assessment: no apparent nausea or vomiting Anesthetic complications: no   No notable events documented.   Last Vitals:  Vitals:   12/31/21 1157 12/31/21 1207  BP: (!) 152/104 (!) 179/106  Pulse: 60 (!) 53  Resp: 18 18  Temp: 36.7 C   SpO2: 99% 100%    Last Pain:  Vitals:   12/31/21 1157  TempSrc:   PainSc: 0-No pain                 Lenard Simmer

## 2021-12-31 NOTE — H&P (Signed)
Wyline Mood, MD [address], [address], [place], [place], [address] [address], [place], [place], [address] Phone: [phone]  Fax: [phone]  Primary Care Physician:  Gareth Morgan, MD   Pre-Procedure History & Physical: HPI:  Troy [name] is a 61 y.o. male is here for an endoscopy    Past Medical History:  Diagnosis Date   Alcohol dependence (H[facility])    Anxiety    Chest pain    GERD (gastroesophageal reflux disease)    Hypertension    Insomnia    Iron deficiency anemia 11/25/2016   Plantar fascial fibromatosis    Psoriasis     Past Surgical History:  Procedure Laterality Date   BIOPSY  12/01/2016   Procedure: BIOPSY;  Surgeon: West Bali, MD;  Location: [facility] ENDO SU[facility];  Service: Endoscopy;;  gastric duodenal hiatal hernia pouch   COLONOSCOPY WITH PROPOFOL N/A 12/01/2016   Procedure: COLONOSCOPY WITH PROPOFOL;  Surgeon: West Bali, MD;  Location: [facility] ENDO SU[facility];  Service: Endoscopy;  Laterality: N/A;  1:45 pm   ESOPHAGOGASTRODUODENOSCOPY  2004   Dr. 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, unknown path    ESOPHAGOGASTRODUODENOSCOPY (EGD) WITH PROPOFOL N/A 12/01/2016   Procedure: ESOPHAGOGASTRODUODENOSCOPY (EGD) WITH PROPOFOL;  Surgeon: West Bali, MD;  Location: [facility];  Service: Endoscopy;  Laterality: N/A;   GIVENS CAPSULE STUDY N/A 12/23/2016   Procedure: GIVENS CAPSULE STUDY;  Surgeon: West Bali, MD;  Location: [facility];  Service: Endoscopy;  Laterality: N/A;  arrive at 7:00am for 7:30am appt time   HEMORRHOID SURGERY     residual skin tags   right ankle     UMBILICAL HERNIA REPAIR N/A 06/07/2014   Procedure: HERNIA REPAIR UMBILICAL ADULT;  Surgeon: Marlane Hatcher, MD;  Location: [facility] ORS;  Service: General;  Laterality: N/A;    Prior to Admission medications   Medication Sig Start Date End Date Taking? Authorizing Provider  metoprolol tartrate (LOPRESSOR) 100 MG tablet Take 100 mg 2 hours before cardiac CT  10/03/21  Yes Little Ishikawa, MD  sildenafil (VIAGRA) 100 MG tablet SMARTSIG:1 Tablet(s) By Mouth 09/30/21  Yes [provider]  Vitamin D, Ergocalciferol, (DRISDOL) 1.25 MG (50000 UNIT) CAPS capsule TAKE ONE CAPSULE (50,000 UNITS TOTAL) BYMOUTH ONCE A WEEK 11/28/21  Yes Pennington, Rebekah M, PA-C    Allergies as of 12/30/2021   (No Known Allergies)    Family History  Problem Relation Age of Onset   Heart disease Mother        stent placement   Heart attack Father        died from MR age 88   Heart attack Maternal Grandmother    Colon cancer Paternal Grandfather     Social History   Socioeconomic History   Marital status: Married    Spouse name: Not on file   Number of children: Not on file   Years of education: Not on file   Highest education level: Not on file  Occupational History   Not on file  Tobacco Use   Smoking status: Never   Smokeless tobacco: Never  Vaping Use   Vaping Use: Never used  Substance and Sexual Activity   Alcohol use: Yes    Comment: 6-12 beers at a time sometimes daily.    Drug use: No   Sexual activity: Yes  Other Topics Concern   Not on file  Social History Narrative   Not on file  Social Determinants of Health   Financial Resource Strain: Not on file  Food Insecurity: Not on file  Transportation Needs: Not on file  Physical Activity: Not on file  Stress: Not on file  Social Connections: Not on file  Intimate Partner Violence: Not At Risk   Fear of Current or Ex-Partner: No   Emotionally Abused: No   Physically Abused: No   Sexually Abused: No    Review of Systems: See HPI, otherwise negative ROS  Physical Exam: BP (!) 173/99    Pulse 65    Temp 98.6 F (37 C) (Oral)    Resp 20    Ht 6\' 2"  (1.88 m)    Wt 92.5 kg    SpO2 100%    BMI 26.19 kg/m  General:   Alert,  pleasant and cooperative in NAD Head:  Normocephalic and atraumatic. Neck:  Supple; no masses or thyromegaly. Lungs:  Clear throughout to  auscultation, normal respiratory effort.    Heart:  +S1, +S2, Regular rate and rhythm, No edema. Abdomen:  Soft, nontender and nondistended. Normal bowel sounds, without guarding, and without rebound.   Neurologic:  Alert and  oriented x4;  grossly normal neurologically.  Impression/Plan: is here for an endoscopy  to be performed for  evaluation of hiatal hernia    Risks, benefits, limitations, and alternatives regarding endoscopy have been reviewed with the patient.  Questions have been answered.  All parties agreeable.   [name], MD  12/31/2021, 11:26 AM

## 2021-12-31 NOTE — Transfer of Care (Signed)
Immediate Anesthesia Transfer of Care Note  Patient: Troy [name]  Procedure(s) Performed: ESOPHAGOGASTRODUODENOSCOPY (EGD) WITH PROPOFOL  Patient Location: Endoscopy Unit  Anesthesia Type:General  Level of Consciousness: awake  Airway & Oxygen Therapy: Patient Spontanous Breathing  Post-op Assessment: Report given to RN and Post -op Vital signs reviewed and stable  Post vital signs: Reviewed and stable  Last Vitals:  Vitals Value Taken Time  BP    Temp    Pulse    Resp    SpO2      Last Pain:  Vitals:   12/31/21 1007  TempSrc: Oral  PainSc: 0-No pain         Complications: No notable events documented.

## 2022-01-02 LAB — SURGICAL PATHOLOGY

## 2022-01-04 ENCOUNTER — Encounter: Payer: Self-pay | Admitting: Gastroenterology

## 2022-01-06 ENCOUNTER — Telehealth: Payer: Self-pay

## 2022-01-06 ENCOUNTER — Other Ambulatory Visit: Payer: Self-pay

## 2022-01-06 ENCOUNTER — Ambulatory Visit
Admission: RE | Admit: 2022-01-06 | Discharge: 2022-01-06 | Disposition: A | Discharge status: Home / Self Care | Payer: BC Managed Care – PPO | Source: Ambulatory Visit | Attending: Surgery | Admitting: Surgery

## 2022-01-06 DIAGNOSIS — K449 Diaphragmatic hernia without obstruction or gangrene: Secondary | ICD-10-CM | POA: Diagnosis not present

## 2022-01-06 MED ORDER — IOHEXOL 300 MG/ML  SOLN
100.0000 mL | Freq: Once | INTRAMUSCULAR | Status: AC | PRN
  Administered 2022-01-06: 100 mL via INTRAVENOUS

## 2022-01-06 NOTE — Telephone Encounter (Signed)
Patient notified of results. Rescheduled to 01/21/22 @ 8:45 am.

## 2022-01-14 ENCOUNTER — Other Ambulatory Visit: Payer: BC Managed Care – PPO

## 2022-01-15 ENCOUNTER — Telehealth: Payer: Self-pay | Admitting: *Deleted

## 2022-01-15 ENCOUNTER — Telehealth: Payer: Self-pay | Admitting: Surgery

## 2022-01-15 NOTE — Telephone Encounter (Signed)
Called scheduling and was able to get him rescheduled for his Barium swallow study for Monday March 6th at 12:00 pm at [facility]. He will need to arrive at the [place] entrance at 11:30 am and have nothing to eat or drink for 3 hours prior.  ?He will follow up with Dr Dahlia Byes on Wednesday March 8th.  ?The patient was very appreciative of this.  ?

## 2022-01-15 NOTE — Telephone Encounter (Signed)
Patient called, is very upset.  Apparently someone from imaging called him to reschedule his imaging and got it all messed up.  He wants imaging rescheduled ASAP please call him.   ?

## 2022-01-15 NOTE — Telephone Encounter (Signed)
error 

## 2022-01-19 ENCOUNTER — Ambulatory Visit
Admission: RE | Admit: 2022-01-19 | Discharge: 2022-01-19 | Disposition: A | Discharge status: Home / Self Care | Payer: BC Managed Care – PPO | Source: Ambulatory Visit | Attending: Surgery | Admitting: Surgery

## 2022-01-19 ENCOUNTER — Other Ambulatory Visit: Payer: Self-pay

## 2022-01-19 ENCOUNTER — Telehealth: Payer: Self-pay

## 2022-01-19 ENCOUNTER — Ambulatory Visit: Payer: BC Managed Care – PPO | Admitting: Surgery

## 2022-01-19 DIAGNOSIS — K449 Diaphragmatic hernia without obstruction or gangrene: Secondary | ICD-10-CM | POA: Diagnosis not present

## 2022-01-19 NOTE — Telephone Encounter (Signed)
Message left for the patient letting him know we have his Barium Swallow results.  ?

## 2022-01-19 NOTE — Telephone Encounter (Signed)
-----   Message from Leafy Ro, MD sent at 01/19/2022  1:06 PM EST ----- ?Please let him know that swallow study did show a good sized hernia ?----- Message ----- ?From: Interface, Rad Results In ?Sent: 01/19/2022  12:39 PM EST ?To: Leafy Ro, MD ? ? ?

## 2022-01-21 ENCOUNTER — Encounter: Payer: Self-pay | Admitting: Surgery

## 2022-01-21 ENCOUNTER — Ambulatory Visit: Payer: BC Managed Care – PPO | Admitting: Surgery

## 2022-01-21 ENCOUNTER — Other Ambulatory Visit: Payer: Self-pay

## 2022-01-21 ENCOUNTER — Ambulatory Visit (INDEPENDENT_AMBULATORY_CARE_PROVIDER_SITE_OTHER): Payer: BC Managed Care – PPO | Admitting: Surgery

## 2022-01-21 VITALS — BP 232/141 | HR 71 | Temp 98.7°F | Ht 74.0 in | Wt 204.0 lb

## 2022-01-21 DIAGNOSIS — K449 Diaphragmatic hernia without obstruction or gangrene: Secondary | ICD-10-CM

## 2022-01-21 NOTE — Patient Instructions (Signed)
Our surgery scheduler Britta Mccreedy will call you within 24-48 hours to get you scheduled. If you have not heard from her after 48 hours, please call our office. You will need to get Covid tested before surgery and have the blue sheet available when she calls to write down important information. ? ? ?If you have any concerns or questions, please feel free to call our office.  ? ?Eating Plan After Nissen Fundoplication ?After a Nissen fundoplication procedure, it is common to have some difficulty swallowing. The part of your body that moves food and liquid from your mouth to your stomach (esophagus) will be swollen and may feel tight. It will take several weeks or months for your esophagus and stomach to heal. ?By following a special eating plan, you can prevent problems such as pain, swelling or pressure in the abdomen (bloating), gas, nausea, or diarrhea. ?What are tips for following this plan? ?Cooking ?Cook all foods until they are soft. ?Remove skins and seeds from fruits and vegetables before eating. ?Remove skin and gristle from meats. Grind or finely mince meats before eating. ?Avoid over-cooking meat. Dry, tough meat is more difficult to swallow. ?Avoid using oil when cooking, or use only a small amount of oil. ?Avoid using seasoning when cooking, or use only a small amount of seasoning. ?Toast bread before eating. This makes it easier to swallow. ?Meal planning ? ?Eat 6-8 small meals throughout the day. ?Right after the surgery, have a few meals that are only clear liquids. Clear liquids include: ?Water. ?Clear fruit juice, no pulp. ?Chicken, beef, or vegetable broth. ?Gelatin. ?Decaffeinated tea or coffee without milk. ?Popsicles or shaved ice. ?Depending on your progress, you may move to a full liquid diet as told by your health care provider. This includes clear liquids and the following: ?Dairy and alternative milks, such as soy milk. ?Strained creamed soups. ?Ice cream or sherbet. ?Pudding. ?Nutritional  supplement drinks. ?Yogurt. ?A few days after surgery, you may be able to start eating a diet of soft foods. You may need to eat according to this plan for several weeks. ?Do not eat sweets or sweetened drinks at the beginning of a meal. Doing that may cause your stomach to empty faster than it should (dumping syndrome). ?Lifestyle ?Always sit upright when eating or drinking. ?Eat slowly. Take small bites and chew food well before swallowing. ?Do not lie down after eating. Stay sitting up for 30 minutes or longer after each meal. ?Sip fluids between meals. ?Limit how much you drink at one time. With meals and snacks, have 4-8 oz (120-240 mL). This is equal to ? cup-1 cup. ?Do not mix solid foods and liquids in the same mouthful. ?Drink enough fluid to keep your urine pale yellow. ?Do not chew gum or drink fluids through a straw. Doing those things may cause you to swallow extra air. ?General information ?Do not drink carbonated drinks or alcohol. ?Avoid foods and drinks that contain caffeine and chocolate. ?Avoid foods and drinks that contain citrus or tomato. ?Allow hot soups and drinks to cool before eating. ?Avoid foods that cause gas, such as beans, peas, broccoli, or cabbage. ?If dairy milk products cause diarrhea, avoid them or eat them in small amounts. ?Recommended foods ?Fruits ?Any soft-cooked fruits after skins and seeds are removed. Fruit juice. ?Vegetables ?Any soft-cooked vegetables after skins and seeds are removed. Vegetable juice. ?Grains ?Cooked cereals. Dry cereals softened with liquid. Cooked pasta, rice, or other grains. Toasted bread. Bland crackers, such as soda or graham  crackers. ?Meats and other protein foods ?Tender cuts of meat, poultry, or fish after bones, skin, and gristle are removed. Poached, boiled, or scrambled eggs. Canned fish. Tofu. Creamy nut butters. ?Dairy ?Milk. Yogurt. Cottage cheese. Mild cheeses. ?Beverages ?Nutritional supplement drinks. Decaffeinated tea or coffee.  Sports drinks. ?Fats and oils ?Butter. Margarine. Mayonnaise. Vegetable oil. Smooth salad dressing. ?Sweets and desserts ?Plain hard candy. Marshmallows. Pudding. Ice cream. Gelatin. Sherbet. ?Seasoning and other foods ?Salt. Light seasonings. Mustard. Vinegar. ?The items listed above may not be a complete list of recommended foods and beverages. Contact a dietitian for more information. ?Foods to avoid ?Fruits ?Oranges. Grapefruit. Lemons. Limes. Citrus juices. Dried fruit. Crunchy, raw fruits. ?Vegetables ?Tomato sauce. Tomato juice. Broccoli. Cauliflower. Cabbage. Brussels sprouts. Crunchy, raw vegetables. ?Grains ?High-fiber or bran cereal. Cereal with nuts, dried fruit, or coconut. Sweet breads, rolls, coffee cake, or donuts. Chewy or crusty breads. Popcorn. ?Meats and other protein foods ?Beans, peas, and lentils. Tough or fatty meats. Fried meats, chicken, or fish. Fried eggs. Nuts and seeds. Crunchy nut butters. ?Dairy ?Chocolate milk. Yogurt with chunks of fruit, nuts, seeds, or coconut. Strong cheeses. ?Beverages ?Carbonated soft drinks. Alcohol. Cocoa. Hot drinks. ?Fats and oils ?Bacon fat. Lard. ?Sweets and desserts ?Chocolate. Candy with nuts, coconut, or seeds. Peppermint. Cookies. Cakes. Pie crust. ?Seasoning and other foods ?Heavy seasonings. Chili sauce. Ketchup. Barbecue sauce. Rosita Fire. Horseradish. ?The items listed above may not be a complete list of foods and beverages to avoid. Contact a dietitian for more information. ?Summary ?Following this eating plan after a Nissen fundoplication is an important part of healing after surgery. ?After surgery, you will start with a clear liquid diet before you progress to full liquids and soft foods. You may need to eat soft foods for several weeks. ?Avoid eating foods that cause irritation, gas, nausea, diarrhea, or swelling or pressure in the abdomen (bloating), and avoid foods that are difficult to swallow. ?Talk with a dietitian about which dietary  choices are best for you. ?This information is not intended to replace advice given to you by your health care provider. Make sure you discuss any questions you have with your health care provider. ?Document Revised: 05/19/2020 Document Reviewed: 05/19/2020 ?Elsevier Patient Education ? 2022 [employer] ? ?

## 2022-01-22 ENCOUNTER — Telehealth: Payer: Self-pay | Admitting: Surgery

## 2022-01-22 ENCOUNTER — Encounter: Payer: Self-pay | Admitting: Surgery

## 2022-01-22 NOTE — Progress Notes (Signed)
Outpatient Surgical Follow Up ? ?01/22/2022 ? ?Troy [name] is an 61 y.o. male.  ? ?Chief Complaint  ?Patient presents with  ? Follow-up  ?  Hiatal hernia  ? ? ?HPI: 61-year-old male seen in follow-up for a large paraesophageal hernia.  He continues to have significant pain symptoms to include chest pain, reflux, significant burping and regurgitation of undigested food.  He did complete the work-up to include a CT scan of the abdomen pelvis as well as a barium swallow and an EGD.  Please note that I have personally reviewed all those images.  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  There is at least two third of the stomach within the mediastinum swallows shows some decreased peristalsis although there read is not excluding presbyesophagus I do think that the main issue with him is his large paraesophageal hernia.  This obviously can cloud the actual read of the esophageal function.  Clinically he does not have significant dysphagia ? ?Past Medical History:  ?Diagnosis Date  ? Alcohol dependence (HCC)   ? Anxiety   ? Chest pain   ? GERD (gastroesophageal reflux disease)   ? Hypertension   ? Insomnia   ? Iron deficiency anemia 11/25/2016  ? Plantar fascial fibromatosis   ? Psoriasis   ? ? ?Past Surgical History:  ?Procedure Laterality Date  ? BIOPSY  12/01/2016  ? Procedure: BIOPSY;  Surgeon: Sandi L Fields, MD;  [facility];  Service: Endoscopy;;  gastric ?duodenal ?hiatal hernia pouch  ? COLONOSCOPY WITH PROPOFOL N/A 12/01/2016  ? Procedure: COLONOSCOPY WITH PROPOFOL;  Surgeon: Sandi L Fields, MD;  [facility]: [facility];  Service: Endoscopy;  Laterality: N/A;  1:45 pm  ? ESOPHAGOGASTRODUODENOSCOPY  2004  ? Dr. Rehman: stricture at GE junction s/p dilation, unknown path   ? ESOPHAGOGASTRODUODENOSCOPY (EGD) WITH PROPOFOL N/A 12/01/2016  ? Procedure: ESOPHAGOGASTRODUODENOSCOPY (EGD) WITH PROPOFOL;  Surgeon: Sandi L Fields, MD;  [facility];  Service: Endoscopy;  Laterality: N/A;   ? ESOPHAGOGASTRODUODENOSCOPY (EGD) WITH PROPOFOL N/A 12/31/2021  ? Procedure: ESOPHAGOGASTRODUODENOSCOPY (EGD) WITH PROPOFOL;  Surgeon: Anna, Kiran, MD;  Location: [facility];  Service: Gastroenterology;  Laterality: N/A;  ? GIVENS CAPSULE STUDY N/A 12/23/2016  ? Procedure: GIVENS CAPSULE STUDY;  Surgeon: Sandi L Fields, MD;  [facility];  Service: Endoscopy;  Laterality: N/A;  arrive at 7:00am for 7:30am appt time  ? HEMORRHOID SURGERY    ? residual skin tags  ? right ankle    ? UMBILICAL HERNIA REPAIR N/A 06/07/2014  ? Procedure: HERNIA REPAIR UMBILICAL ADULT;  Surgeon: William S Bradford, MD;  Location: [facility];  Service: General;  Laterality: N/A;  ? ? ?Family History  ?Problem Relation Age of Onset  ? Heart disease Mother   ?     stent placement  ? Heart attack Father   ?     died from MR age 67  ? Heart attack Maternal Grandmother   ? Colon cancer Paternal Grandfather   ? ? ?Social History:  reports that he has never smoked. He has never used smokeless tobacco. He reports current alcohol use. He reports that he does not use drugs. ? ?Allergies: No Known Allergies ? ?Medications reviewed. ? ? ? ?ROS ?Full ROS performed and is otherwise negative other than what is stated in HPI ? ? ?BP (!) 232/141   Pulse 71   Temp 98.7 ?F (37.1 ?C) (Oral)   Ht 6' 2" (1.88 m)   Wt   204 lb (92.5 kg)   SpO2 99%   BMI 26.19 kg/m?  ? ?Physical Exam ?Vitals and nursing note reviewed. Exam conducted with a chaperone present.  ?Constitutional:   ?   General: He is not in acute distress. ?   Appearance: Normal appearance. He is normal weight.  ?Eyes:  ?   General: No scleral icterus.    ?   Right eye: No discharge.     ?   Left eye: No discharge.  ?Cardiovascular:  ?   Rate and Rhythm: Normal rate and regular rhythm.  ?   Heart sounds: No murmur heard. ?Pulmonary:  ?   Effort: Pulmonary effort is normal. No respiratory distress.  ?   Breath sounds: Normal breath sounds. No stridor.  ?Abdominal:  ?   General: Abdomen is  flat. There is no distension.  ?   Palpations: Abdomen is soft. There is no mass.  ?   Tenderness: There is no abdominal tenderness. There is no guarding or rebound.  ?   Hernia: No hernia is present.  ?Musculoskeletal:     ?   General: Normal range of motion.  ?   Cervical back: Normal range of motion and neck supple. No rigidity or tenderness.  ?Lymphadenopathy:  ?   Cervical: No cervical adenopathy.  ?Skin: ?   General: Skin is warm and dry.  ?   Capillary Refill: Capillary refill takes less than 2 seconds.  ?Neurological:  ?   General: No focal deficit present.  ?   Mental Status: He is alert and oriented to person, place, and time.  ?Psychiatric:     ?   Mood and Affect: Mood normal.     ?   Behavior: Behavior normal.     ?   Thought Content: Thought content normal.     ?   Judgment: Judgment normal.  ? ? ? ? ?Assessment/Plan: ?61-year-old male with a symptomatic large type III paraesophageal hernia.  I do think that this warrants surgical repair and I do think that he will be a good candidate for robotic approach.  Procedure discussed with the patient in detail.  Risks, benefits and possible complications including but not limited to: Bleeding, infection, dysphagia, pain, esophageal or bowel injuries.  He understands and wished to proceed.  He is also potentially interested in having removed some of the hemorrhoids/skin tags.  I did explain to him that this will add significant pain.  He wishes to think about it.  We will posted as a possible hemorrhoid ectomy as well as a robotic paraesophageal hernia repair with Nissen fundoplication and mesh. ?He already contacted his PCP regarding his high blood pressure.  I do think that we still have time to get her optimized from a high blood pressure perspective. ?Note that I have spent greater than 45 minutes in this encounter including coordination of his care, personally reviewing imaging studies, placing orders, counseling the patient and the family and performing  a proper documentation ? ? ?Lashandra Arauz, MD FACS ?General Surgeon  ?

## 2022-01-22 NOTE — H&P (View-Only) (Signed)
Outpatient Surgical Follow Up ? ?01/22/2022 ? ?Troy [name] is an 61 y.o. male.  ? ?Chief Complaint  ?Patient presents with  ? Follow-up  ?  Hiatal hernia  ? ? ?HPI: 61 year old male seen in follow-up for a large paraesophageal hernia.  He continues to have significant pain symptoms to include chest pain, reflux, significant burping and regurgitation of undigested food.  He did complete the work-up to include a CT scan of the abdomen pelvis as well as a barium swallow and an EGD.  Please note that I have personally reviewed all those images.  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  There is at least two third of the stomach within the mediastinum swallows shows some decreased peristalsis although there read is not excluding presbyesophagus I do think that the main issue with him is his large paraesophageal hernia.  This obviously can cloud the actual read of the esophageal function.  Clinically he does not have significant dysphagia ? ?Past Medical History:  ?Diagnosis Date  ? Alcohol dependence ([place])   ? Anxiety   ? Chest pain   ? GERD (gastroesophageal reflux disease)   ? Hypertension   ? Insomnia   ? Iron deficiency anemia 11/25/2016  ? Plantar fascial fibromatosis   ? Psoriasis   ? ? ?Past Surgical History:  ?Procedure Laterality Date  ? BIOPSY  12/01/2016  ? Procedure: BIOPSY;  Surgeon: Danie Binder, MD;  [facility];  Service: Endoscopy;;  gastric ?duodenal ?hiatal hernia pouch  ? COLONOSCOPY WITH PROPOFOL N/A 12/01/2016  ? Procedure: COLONOSCOPY WITH PROPOFOL;  Surgeon: Danie Binder, MD;  [facility];  Service: Endoscopy;  Laterality: N/A;  1:45 pm  ? ESOPHAGOGASTRODUODENOSCOPY  2004  ? Dr. Laural Golden: stricture at [place] junction s/p dilation, unknown path   ? ESOPHAGOGASTRODUODENOSCOPY (EGD) WITH PROPOFOL N/A 12/01/2016  ? Procedure: ESOPHAGOGASTRODUODENOSCOPY (EGD) WITH PROPOFOL;  Surgeon: Danie Binder, MD;  [facility];  Service: Endoscopy;  Laterality: N/A;   ? ESOPHAGOGASTRODUODENOSCOPY (EGD) WITH PROPOFOL N/A 12/31/2021  ? Procedure: ESOPHAGOGASTRODUODENOSCOPY (EGD) WITH PROPOFOL;  Surgeon: Jonathon Bellows, MD;  Location: [facility];  Service: Gastroenterology;  Laterality: N/A;  ? GIVENS CAPSULE STUDY N/A 12/23/2016  ? Procedure: GIVENS CAPSULE STUDY;  Surgeon: Danie Binder, MD;  [facility];  Service: Endoscopy;  Laterality: N/A;  arrive at 7:00am for 7:30am appt time  ? HEMORRHOID SURGERY    ? residual skin tags  ? right ankle    ? UMBILICAL HERNIA REPAIR N/A 06/07/2014  ? Procedure: HERNIA REPAIR UMBILICAL ADULT;  Surgeon: Scherry Ran, MD;  Location: [facility];  Service: General;  Laterality: N/A;  ? ? ?Family History  ?Problem Relation Age of Onset  ? Heart disease Mother   ?     stent placement  ? Heart attack Father   ?     died from MR age 37  ? Heart attack Maternal Grandmother   ? Colon cancer Paternal Grandfather   ? ? ?Social History:  reports that he has never smoked. He has never used smokeless tobacco. He reports current alcohol use. He reports that he does not use drugs. ? ?Allergies: No Known Allergies ? ?Medications reviewed. ? ? ? ?ROS ?Full ROS performed and is otherwise negative other than what is stated in HPI ? ? ?BP (!) 232/141   Pulse 71   Temp 98.7 ?F (37.1 ?C) (Oral)   Ht 6\' 2"  (1.88 m)   Wt  204 lb (92.5 kg)   SpO2 99%   BMI 26.19 kg/m?  ? ?Physical Exam ?Vitals and nursing note reviewed. Exam conducted with a chaperone present.  ?Constitutional:   ?   General: He is not in acute distress. ?   Appearance: Normal appearance. He is normal weight.  ?Eyes:  ?   General: No scleral icterus.    ?   Right eye: No discharge.     ?   Left eye: No discharge.  ?Cardiovascular:  ?   Rate and Rhythm: Normal rate and regular rhythm.  ?   Heart sounds: No murmur heard. ?Pulmonary:  ?   Effort: Pulmonary effort is normal. No respiratory distress.  ?   Breath sounds: Normal breath sounds. No stridor.  ?Abdominal:  ?   General: Abdomen is  flat. There is no distension.  ?   Palpations: Abdomen is soft. There is no mass.  ?   Tenderness: There is no abdominal tenderness. There is no guarding or rebound.  ?   Hernia: No hernia is present.  ?Musculoskeletal:     ?   General: Normal range of motion.  ?   Cervical back: Normal range of motion and neck supple. No rigidity or tenderness.  ?Lymphadenopathy:  ?   Cervical: No cervical adenopathy.  ?Skin: ?   General: Skin is warm and dry.  ?   Capillary Refill: Capillary refill takes less than 2 seconds.  ?Neurological:  ?   General: No focal deficit present.  ?   Mental Status: He is alert and oriented to person, place, and time.  ?Psychiatric:     ?   Mood and Affect: Mood normal.     ?   Behavior: Behavior normal.     ?   Thought Content: Thought content normal.     ?   Judgment: Judgment normal.  ? ? ? ? ?Assessment/Plan: ?61 year old male with a symptomatic large type III paraesophageal hernia.  I do think that this warrants surgical repair and I do think that he will be a good candidate for robotic approach.  Procedure discussed with the patient in detail.  Risks, benefits and possible complications including but not limited to: Bleeding, infection, dysphagia, pain, esophageal or bowel injuries.  He understands and wished to proceed.  He is also potentially interested in having removed some of the hemorrhoids/skin tags.  I did explain to him that this will add significant pain.  He wishes to think about it.  We will posted as a possible hemorrhoid ectomy as well as a robotic paraesophageal hernia repair with Nissen fundoplication and mesh. ?He already contacted his PCP regarding his high blood pressure.  I do think that we still have time to get her optimized from a high blood pressure perspective. ?Note that I have spent greater than 45 minutes in this encounter including coordination of his care, personally reviewing imaging studies, placing orders, counseling the patient and the family and performing  a proper documentation ? ? ?Caroleen Hamman, MD FACS ?General Surgeon  ?

## 2022-01-22 NOTE — Telephone Encounter (Signed)
Patient has been advised of Pre-Admission date/time, COVID Testing date and Surgery date. ? ?Surgery Date: 02/03/22 ?Preadmission Testing Date: 01/28/22 (phone 8a-1p) ?Covid Testing Date: 01/30/22 @ 8:45 am  - patient advised to go to the [place] [facility] ([place]) between 8a-11:00 am ? ?Patient has been made aware to call [phone], between 1-3:00pm the day before surgery, to find out what time to arrive for surgery.   ? ?

## 2022-01-28 ENCOUNTER — Encounter
Admission: RE | Admit: 2022-01-28 | Discharge: 2022-01-28 | Disposition: A | Discharge status: Home / Self Care | Payer: BC Managed Care – PPO | Source: Ambulatory Visit | Attending: Surgery | Admitting: Surgery

## 2022-01-28 ENCOUNTER — Other Ambulatory Visit: Payer: Self-pay

## 2022-01-28 NOTE — Patient Instructions (Addendum)
Your procedure is scheduled on: Tuesday 02/03/22 ?Report to the Registration Desk on the 1st floor of the [facility]. ?To find out your arrival time, please call [phone] between 1PM - 3PM on: Monday 02/02/22 ? ?REMEMBER: ?Instructions that are not followed completely may result in serious medical risk, up to and including death; or upon the discretion of your surgeon and anesthesiologist your surgery may need to be rescheduled. ? ?Do not eat food after midnight the night before surgery.  ?No gum chewing, lozengers or hard candies. ? ?You may however, drink CLEAR liquids up to 2 hours before you are scheduled to arrive for your surgery. Do not drink anything within 2 hours of your scheduled arrival time. ? ?Clear liquids include: ?- water  ?- apple juice without pulp ?- gatorade (not RED colors) ?- black coffee or tea (Do NOT add milk or creamers to the coffee or tea) ?Do NOT drink anything that is not on this list. ? ?TAKE THESE MEDICATIONS THE MORNING OF SURGERY WITH A SIP OF WATER: NONE ? ?One week prior to surgery: ?Stop Anti-inflammatories (NSAIDS) such as Advil, Aleve, Ibuprofen, Motrin, Naproxen, Naprosyn and Aspirin based products such as Excedrin, Goodys Powder, BC Powder. ?Stop ANY OVER THE COUNTER supplements until after surgery. ?You may however, continue to take Tylenol if needed for pain up until the day of surgery. ? ?No Alcohol for 24 hours before or after surgery. ? ?No Smoking including e-cigarettes for 24 hours prior to surgery.  ?No chewable tobacco products for at least 6 hours prior to surgery.  ?No nicotine patches on the day of surgery. ? ?Do not use any "recreational" drugs for at least a week prior to your surgery.  ?Please be advised that the combination of cocaine and anesthesia may have negative outcomes, up to and including death. ?If you test positive for cocaine, your surgery will be cancelled. ? ?On the morning of surgery brush your teeth with toothpaste and water, you may  rinse your mouth with mouthwash if you wish. ?Do not swallow any toothpaste or mouthwash. ? ?Use Dial soap the night prior to surgery and then put on clean pajamas and a clean sheet on your bed. Use Dial soap again the morning of surgery. ? ?Do not wear jewelry. ? ?Do not wear lotions, powders, or colognes.  ? ?Do not shave body from the neck down 48 hours prior to surgery just in case you cut yourself which could leave a site for infection.  ? ?Do not bring valuables to the hospital. [facility] is not responsible for any missing/lost belongings or valuables.  ? ?Notify your doctor if there is any change in your medical condition (cold, fever, infection). ? ?Wear comfortable clothing (specific to your surgery type) to the hospital. No jeans or dress pants. ? ?After surgery, you can help prevent lung complications by doing breathing exercises.  ?Take deep breaths and cough every 1-2 hours. Your doctor may order a device called an Incentive Spirometer to help you take deep breaths. ?When coughing or sneezing, hold a pillow firmly against your incision with both hands. This is called ?splinting.? Doing this helps protect your incision. It also decreases belly discomfort. ? ?If you are being admitted to the hospital overnight, leave your suitcase in the car. ?After surgery it may be brought to your room. ? ?If you are being discharged the day of surgery, you will not be allowed to drive home. ?You will need a responsible adult (18 years or older)  to drive you home and stay with you that night.  ? ?If you are taking public transportation, you will need to have a responsible adult (18 years or older) with you. ?Please confirm with your physician that it is acceptable to use public transportation.  ? ?Please call Silva Bandy in the [facility] Dept. at [phone] if you have any questions about these instructions.  ? ?Surgery Visitation Policy: ? ?Patients undergoing a surgery or procedure may have one family  member or support person with them as long as that person is not COVID-19 positive or experiencing its symptoms.  ?That person may remain in the waiting area during the procedure and may rotate out with other people. ? ?Inpatient Visitation:   ? ?Visiting hours are 7 a.m. to 8 p.m. ?Up to two visitors ages 16+ are allowed at one time in a patient room. The visitors may rotate out with other people during the day. Visitors must check out when they leave, or other visitors will not be allowed. One designated support person may remain overnight. ?The visitor must pass COVID-19 screenings, use hand sanitizer when entering and exiting the patient?s room and wear a mask at all times, including in the patient?s room. ?Patients must also wear a mask when staff or their visitor are in the room. ?Masking is required regardless of vaccination status.  ?

## 2022-01-30 ENCOUNTER — Other Ambulatory Visit: Admission: RE | Admit: 2022-01-30 | Payer: BC Managed Care – PPO | Source: Ambulatory Visit

## 2022-02-03 ENCOUNTER — Other Ambulatory Visit: Payer: Self-pay

## 2022-02-03 ENCOUNTER — Observation Stay
Admission: RE | Admit: 2022-02-03 | Discharge: 2022-02-04 | Disposition: A | Discharge status: Home / Self Care | Payer: BC Managed Care – PPO | Attending: Surgery | Admitting: Surgery

## 2022-02-03 ENCOUNTER — Encounter
Admission: RE | Disposition: A | Discharge status: Home / Self Care | Payer: Self-pay | Source: Home / Self Care | Attending: Surgery

## 2022-02-03 ENCOUNTER — Encounter: Payer: Self-pay | Admitting: Surgery

## 2022-02-03 ENCOUNTER — Ambulatory Visit: Payer: BC Managed Care – PPO | Admitting: Anesthesiology

## 2022-02-03 DIAGNOSIS — K449 Diaphragmatic hernia without obstruction or gangrene: Secondary | ICD-10-CM | POA: Diagnosis present

## 2022-02-03 DIAGNOSIS — Z79899 Other long term (current) drug therapy: Secondary | ICD-10-CM | POA: Insufficient documentation

## 2022-02-03 DIAGNOSIS — I10 Essential (primary) hypertension: Secondary | ICD-10-CM | POA: Diagnosis not present

## 2022-02-03 DIAGNOSIS — K219 Gastro-esophageal reflux disease without esophagitis: Secondary | ICD-10-CM | POA: Insufficient documentation

## 2022-02-03 DIAGNOSIS — Z8719 Personal history of other diseases of the digestive system: Secondary | ICD-10-CM

## 2022-02-03 HISTORY — DX: Failed or difficult intubation, initial encounter: T88.4XXA

## 2022-02-03 HISTORY — PX: XI ROBOTIC ASSISTED PARAESOPHAGEAL HERNIA REPAIR: SHX6871

## 2022-02-03 LAB — CBC
HCT: 41.5 % (ref 39.0–52.0)
Hemoglobin: 13.7 g/dL (ref 13.0–17.0)
MCH: 29.1 pg (ref 26.0–34.0)
MCHC: 33 g/dL (ref 30.0–36.0)
MCV: 88.1 fL (ref 80.0–100.0)
Platelets: 170 10*3/uL (ref 150–400)
RBC: 4.71 MIL/uL (ref 4.22–5.81)
RDW: 12.6 % (ref 11.5–15.5)
WBC: 5.7 10*3/uL (ref 4.0–10.5)
nRBC: 0 % (ref 0.0–0.2)

## 2022-02-03 LAB — HIV ANTIBODY (ROUTINE TESTING W REFLEX): HIV Screen 4th Generation wRfx: NONREACTIVE

## 2022-02-03 LAB — CREATININE, SERUM
Creatinine, Ser: 0.98 mg/dL (ref 0.61–1.24)
GFR, Estimated: >60 mL/min (ref >60)

## 2022-02-03 SURGERY — REPAIR, HERNIA, PARAESOPHAGEAL, ROBOT-ASSISTED
Anesthesia: General

## 2022-02-03 MED ORDER — LIDOCAINE HCL (CARDIAC) PF 100 MG/5ML IV SOSY
PREFILLED_SYRINGE | INTRAVENOUS | Status: DC | PRN
  Administered 2022-02-03: 100 mg via INTRAVENOUS

## 2022-02-03 MED ORDER — ENOXAPARIN SODIUM 40 MG/0.4ML IJ SOSY
40.0000 mg | PREFILLED_SYRINGE | INTRAMUSCULAR | Status: DC
  Administered 2022-02-04: 40 mg via SUBCUTANEOUS
  Filled 2022-02-03: qty 0.4

## 2022-02-03 MED ORDER — GABAPENTIN 300 MG PO CAPS
ORAL_CAPSULE | ORAL | Status: AC
  Administered 2022-02-03: 300 mg via ORAL
  Filled 2022-02-03: qty 1

## 2022-02-03 MED ORDER — EPHEDRINE SULFATE (PRESSORS) 50 MG/ML IJ SOLN
INTRAMUSCULAR | Status: DC | PRN
  Administered 2022-02-03: 7.5 mg via INTRAVENOUS

## 2022-02-03 MED ORDER — MIDAZOLAM HCL 2 MG/2ML IJ SOLN
INTRAMUSCULAR | Status: AC
  Filled 2022-02-03: qty 2

## 2022-02-03 MED ORDER — OXYCODONE HCL 5 MG PO TABS
5.0000 mg | ORAL_TABLET | Freq: Once | ORAL | Status: AC | PRN
  Administered 2022-02-03: 5 mg via ORAL

## 2022-02-03 MED ORDER — DEXAMETHASONE SODIUM PHOSPHATE 10 MG/ML IJ SOLN
INTRAMUSCULAR | Status: DC | PRN
  Administered 2022-02-03: 5 mg via INTRAVENOUS

## 2022-02-03 MED ORDER — FENTANYL CITRATE (PF) 100 MCG/2ML IJ SOLN
INTRAMUSCULAR | Status: DC | PRN
  Administered 2022-02-03 (×2): 25 ug via INTRAVENOUS
  Administered 2022-02-03 (×2): 50 ug via INTRAVENOUS

## 2022-02-03 MED ORDER — PHENYLEPHRINE HCL-NACL 20-0.9 MG/250ML-% IV SOLN
INTRAVENOUS | Status: DC | PRN
  Administered 2022-02-03: 25 ug/min via INTRAVENOUS

## 2022-02-03 MED ORDER — OXYCODONE HCL 5 MG PO TABS
5.0000 mg | ORAL_TABLET | ORAL | Status: DC | PRN
  Administered 2022-02-03 – 2022-02-04 (×2): 5 mg via ORAL
  Filled 2022-02-03 (×2): qty 1

## 2022-02-03 MED ORDER — ONDANSETRON HCL 4 MG/2ML IJ SOLN
INTRAMUSCULAR | Status: AC
  Filled 2022-02-03: qty 2

## 2022-02-03 MED ORDER — PROCHLORPERAZINE EDISYLATE 10 MG/2ML IJ SOLN: 5.0000 mg | Freq: Four times a day (QID) | INTRAMUSCULAR | Status: DC | PRN

## 2022-02-03 MED ORDER — KETAMINE HCL 50 MG/5ML IJ SOSY
PREFILLED_SYRINGE | INTRAMUSCULAR | Status: AC
  Filled 2022-02-03: qty 5

## 2022-02-03 MED ORDER — PROCHLORPERAZINE MALEATE 10 MG PO TABS
10.0000 mg | ORAL_TABLET | Freq: Four times a day (QID) | ORAL | Status: DC | PRN
  Filled 2022-02-03: qty 1

## 2022-02-03 MED ORDER — HEPARIN SODIUM (PORCINE) 5000 UNIT/ML IJ SOLN
INTRAMUSCULAR | Status: AC
  Administered 2022-02-03: 5000 [IU] via SUBCUTANEOUS
  Filled 2022-02-03: qty 1

## 2022-02-03 MED ORDER — METHOCARBAMOL 500 MG PO TABS: 500.0000 mg | ORAL_TABLET | Freq: Four times a day (QID) | ORAL | Status: DC | PRN

## 2022-02-03 MED ORDER — HEPARIN SODIUM (PORCINE) 5000 UNIT/ML IJ SOLN: 5000.0000 [IU] | Freq: Once | INTRAMUSCULAR | Status: AC

## 2022-02-03 MED ORDER — DEXAMETHASONE SODIUM PHOSPHATE 10 MG/ML IJ SOLN
INTRAMUSCULAR | Status: AC
  Filled 2022-02-03: qty 1

## 2022-02-03 MED ORDER — CHLORHEXIDINE GLUCONATE CLOTH 2 % EX PADS: 6.0000 | MEDICATED_PAD | Freq: Once | CUTANEOUS | Status: DC

## 2022-02-03 MED ORDER — ROCURONIUM BROMIDE 100 MG/10ML IV SOLN
INTRAVENOUS | Status: DC | PRN
  Administered 2022-02-03: 20 mg via INTRAVENOUS
  Administered 2022-02-03: 30 mg via INTRAVENOUS
  Administered 2022-02-03: 10 mg via INTRAVENOUS
  Administered 2022-02-03: 20 mg via INTRAVENOUS
  Administered 2022-02-03: 30 mg via INTRAVENOUS

## 2022-02-03 MED ORDER — PROPOFOL 10 MG/ML IV BOLUS
INTRAVENOUS | Status: AC
  Filled 2022-02-03: qty 20

## 2022-02-03 MED ORDER — VISTASEAL 10 ML SINGLE DOSE KIT
PACK | CUTANEOUS | Status: DC | PRN
  Administered 2022-02-03: 10 mL via TOPICAL

## 2022-02-03 MED ORDER — DIPHENHYDRAMINE HCL 12.5 MG/5ML PO ELIX
12.5000 mg | ORAL_SOLUTION | Freq: Four times a day (QID) | ORAL | Status: DC | PRN
  Filled 2022-02-03: qty 5

## 2022-02-03 MED ORDER — LACTATED RINGERS IV SOLN: INTRAVENOUS | Status: DC

## 2022-02-03 MED ORDER — ROCURONIUM BROMIDE 10 MG/ML (PF) SYRINGE
PREFILLED_SYRINGE | INTRAVENOUS | Status: AC
  Filled 2022-02-03: qty 10

## 2022-02-03 MED ORDER — FENTANYL CITRATE (PF) 100 MCG/2ML IJ SOLN
25.0000 ug | INTRAMUSCULAR | Status: AC | PRN
  Administered 2022-02-03 (×2): 50 ug via INTRAVENOUS
  Administered 2022-02-03 (×2): 25 ug via INTRAVENOUS

## 2022-02-03 MED ORDER — SUGAMMADEX SODIUM 200 MG/2ML IV SOLN
INTRAVENOUS | Status: DC | PRN
Start: 2022-02-03 — End: 2022-02-03
  Administered 2022-02-03: 200 mg via INTRAVENOUS

## 2022-02-03 MED ORDER — PREGABALIN 50 MG PO CAPS
100.0000 mg | ORAL_CAPSULE | Freq: Three times a day (TID) | ORAL | Status: DC
  Administered 2022-02-03 – 2022-02-04 (×3): 100 mg via ORAL
  Filled 2022-02-03 (×3): qty 2

## 2022-02-03 MED ORDER — ONDANSETRON HCL 4 MG/2ML IJ SOLN
4.0000 mg | Freq: Four times a day (QID) | INTRAMUSCULAR | Status: DC | PRN
Start: 2022-02-03 — End: 2022-02-04

## 2022-02-03 MED ORDER — FENTANYL CITRATE (PF) 100 MCG/2ML IJ SOLN
INTRAMUSCULAR | Status: AC
  Administered 2022-02-03: 25 ug via INTRAVENOUS
  Filled 2022-02-03: qty 2

## 2022-02-03 MED ORDER — CEFAZOLIN SODIUM-DEXTROSE 2-4 GM/100ML-% IV SOLN
2.0000 g | Freq: Three times a day (TID) | INTRAVENOUS | Status: AC
  Administered 2022-02-03 (×2): 2 g via INTRAVENOUS
  Filled 2022-02-03 (×2): qty 100

## 2022-02-03 MED ORDER — BUPIVACAINE LIPOSOME 1.3 % IJ SUSP
INTRAMUSCULAR | Status: AC
  Filled 2022-02-03: qty 20

## 2022-02-03 MED ORDER — LISINOPRIL 20 MG PO TABS
20.0000 mg | ORAL_TABLET | Freq: Two times a day (BID) | ORAL | Status: DC
  Administered 2022-02-03 – 2022-02-04 (×2): 20 mg via ORAL
  Filled 2022-02-03 (×2): qty 1

## 2022-02-03 MED ORDER — CELECOXIB 200 MG PO CAPS: 200.0000 mg | ORAL_CAPSULE | ORAL | Status: AC

## 2022-02-03 MED ORDER — EPHEDRINE 5 MG/ML INJ
INTRAVENOUS | Status: AC
  Filled 2022-02-03: qty 5

## 2022-02-03 MED ORDER — GABAPENTIN 300 MG PO CAPS: 300.0000 mg | ORAL_CAPSULE | ORAL | Status: AC

## 2022-02-03 MED ORDER — KETAMINE HCL 10 MG/ML IJ SOLN
INTRAMUSCULAR | Status: DC | PRN
  Administered 2022-02-03: 20 mg via INTRAVENOUS
  Administered 2022-02-03: 30 mg via INTRAVENOUS

## 2022-02-03 MED ORDER — CHLORHEXIDINE GLUCONATE 0.12 % MT SOLN
OROMUCOSAL | Status: AC
  Administered 2022-02-03: 15 mL via OROMUCOSAL
  Filled 2022-02-03: qty 15

## 2022-02-03 MED ORDER — FENTANYL CITRATE (PF) 100 MCG/2ML IJ SOLN
INTRAMUSCULAR | Status: AC
  Filled 2022-02-03: qty 2

## 2022-02-03 MED ORDER — BUPIVACAINE-EPINEPHRINE 0.25% -1:200000 IJ SOLN
INTRAMUSCULAR | Status: DC | PRN
  Administered 2022-02-03: 30 mL

## 2022-02-03 MED ORDER — CELECOXIB 200 MG PO CAPS
ORAL_CAPSULE | ORAL | Status: AC
Start: 2022-02-03 — End: 2022-02-03
  Administered 2022-02-03: 200 mg via ORAL
  Filled 2022-02-03: qty 1

## 2022-02-03 MED ORDER — CHLORHEXIDINE GLUCONATE 0.12 % MT SOLN: 15.0000 mL | Freq: Once | OROMUCOSAL | Status: AC

## 2022-02-03 MED ORDER — FENTANYL CITRATE (PF) 100 MCG/2ML IJ SOLN
INTRAMUSCULAR | Status: AC
Start: 2022-02-03 — End: ?
  Filled 2022-02-03: qty 2

## 2022-02-03 MED ORDER — SUCCINYLCHOLINE CHLORIDE 200 MG/10ML IV SOSY
PREFILLED_SYRINGE | INTRAVENOUS | Status: AC
  Filled 2022-02-03: qty 10

## 2022-02-03 MED ORDER — SUCCINYLCHOLINE CHLORIDE 200 MG/10ML IV SOSY
PREFILLED_SYRINGE | INTRAVENOUS | Status: DC | PRN
  Administered 2022-02-03: 120 mg via INTRAVENOUS

## 2022-02-03 MED ORDER — LISINOPRIL 20 MG PO TABS
20.0000 mg | ORAL_TABLET | Freq: Every day | ORAL | Status: DC
  Administered 2022-02-03: 20 mg via ORAL
  Filled 2022-02-03: qty 2

## 2022-02-03 MED ORDER — ACETAMINOPHEN 500 MG PO TABS
1000.0000 mg | ORAL_TABLET | Freq: Four times a day (QID) | ORAL | Status: DC
  Administered 2022-02-03 – 2022-02-04 (×3): 1000 mg via ORAL
  Filled 2022-02-03 (×3): qty 2

## 2022-02-03 MED ORDER — METOPROLOL TARTRATE 5 MG/5ML IV SOLN: 5.0000 mg | Freq: Four times a day (QID) | INTRAVENOUS | Status: DC | PRN

## 2022-02-03 MED ORDER — SODIUM CHLORIDE 0.9 % IV SOLN: INTRAVENOUS | Status: DC

## 2022-02-03 MED ORDER — MORPHINE SULFATE (PF) 2 MG/ML IV SOLN: 2.0000 mg | INTRAVENOUS | Status: DC | PRN

## 2022-02-03 MED ORDER — OXYCODONE HCL 5 MG/5ML PO SOLN: 5.0000 mg | Freq: Once | ORAL | Status: AC | PRN

## 2022-02-03 MED ORDER — GLYCOPYRROLATE 0.2 MG/ML IJ SOLN
INTRAMUSCULAR | Status: DC | PRN
  Administered 2022-02-03: .2 mg via INTRAVENOUS

## 2022-02-03 MED ORDER — OXYCODONE HCL 5 MG PO TABS
ORAL_TABLET | ORAL | Status: AC
  Filled 2022-02-03: qty 1

## 2022-02-03 MED ORDER — PREGABALIN 50 MG PO CAPS
ORAL_CAPSULE | ORAL | Status: AC
  Administered 2022-02-03: 50 mg via ORAL
  Filled 2022-02-03: qty 1

## 2022-02-03 MED ORDER — CEFAZOLIN SODIUM-DEXTROSE 2-4 GM/100ML-% IV SOLN
INTRAVENOUS | Status: AC
  Filled 2022-02-03: qty 100

## 2022-02-03 MED ORDER — CEFAZOLIN SODIUM-DEXTROSE 2-4 GM/100ML-% IV SOLN
2.0000 g | INTRAVENOUS | Status: AC
  Administered 2022-02-03: 2 g via INTRAVENOUS

## 2022-02-03 MED ORDER — FAMOTIDINE 20 MG PO TABS
ORAL_TABLET | ORAL | Status: AC
  Administered 2022-02-03: 20 mg via ORAL
  Filled 2022-02-03: qty 1

## 2022-02-03 MED ORDER — ORAL CARE MOUTH RINSE: 15.0000 mL | Freq: Once | OROMUCOSAL | Status: AC

## 2022-02-03 MED ORDER — DIPHENHYDRAMINE HCL 50 MG/ML IJ SOLN: 12.5000 mg | Freq: Four times a day (QID) | INTRAMUSCULAR | Status: DC | PRN

## 2022-02-03 MED ORDER — VISTASEAL 10 ML SINGLE DOSE KIT
PACK | CUTANEOUS | Status: AC
  Filled 2022-02-03: qty 10

## 2022-02-03 MED ORDER — KETOROLAC TROMETHAMINE 30 MG/ML IJ SOLN
30.0000 mg | Freq: Four times a day (QID) | INTRAMUSCULAR | Status: DC
  Administered 2022-02-03 – 2022-02-04 (×3): 30 mg via INTRAVENOUS
  Filled 2022-02-03 (×3): qty 1

## 2022-02-03 MED ORDER — BUPIVACAINE-EPINEPHRINE (PF) 0.25% -1:200000 IJ SOLN
INTRAMUSCULAR | Status: AC
  Filled 2022-02-03: qty 30

## 2022-02-03 MED ORDER — ACETAMINOPHEN 500 MG PO TABS: 1000.0000 mg | ORAL_TABLET | ORAL | Status: AC

## 2022-02-03 MED ORDER — ACETAMINOPHEN 500 MG PO TABS
ORAL_TABLET | ORAL | Status: AC
  Administered 2022-02-03: 1000 mg via ORAL
  Filled 2022-02-03: qty 2

## 2022-02-03 MED ORDER — FAMOTIDINE 20 MG PO TABS
20.0000 mg | ORAL_TABLET | Freq: Once | ORAL | Status: AC
Start: 2022-02-03 — End: 2022-02-03

## 2022-02-03 MED ORDER — PHENYLEPHRINE 40 MCG/ML (10ML) SYRINGE FOR IV PUSH (FOR BLOOD PRESSURE SUPPORT)
PREFILLED_SYRINGE | INTRAVENOUS | Status: DC | PRN
Start: 2022-02-03 — End: 2022-02-03
  Administered 2022-02-03 (×2): 80 ug via INTRAVENOUS

## 2022-02-03 MED ORDER — BUPIVACAINE LIPOSOME 1.3 % IJ SUSP
INTRAMUSCULAR | Status: DC | PRN
  Administered 2022-02-03: 20 mL

## 2022-02-03 MED ORDER — ONDANSETRON HCL 4 MG/2ML IJ SOLN
INTRAMUSCULAR | Status: DC | PRN
  Administered 2022-02-03: 4 mg via INTRAVENOUS

## 2022-02-03 MED ORDER — PREGABALIN 50 MG PO CAPS: 50.0000 mg | ORAL_CAPSULE | Freq: Three times a day (TID) | ORAL | Status: DC

## 2022-02-03 MED ORDER — ONDANSETRON 4 MG PO TBDP: 4.0000 mg | ORAL_TABLET | Freq: Four times a day (QID) | ORAL | Status: DC | PRN

## 2022-02-03 MED ORDER — MIDAZOLAM HCL 2 MG/2ML IJ SOLN
INTRAMUSCULAR | Status: DC | PRN
  Administered 2022-02-03: 2 mg via INTRAVENOUS

## 2022-02-03 MED ORDER — PROPOFOL 10 MG/ML IV BOLUS
INTRAVENOUS | Status: DC | PRN
  Administered 2022-02-03: 30 mg via INTRAVENOUS
  Administered 2022-02-03: 160 mg via INTRAVENOUS

## 2022-02-03 SURGICAL SUPPLY — 63 items
APPLICATOR VISTASEAL 35 (MISCELLANEOUS) ×1 IMPLANT
BAG RETRIEVAL 10 (BASKET) ×1
BLADE SURG 15 STRL LF DISP TIS (BLADE) ×1 IMPLANT
BLADE SURG 15 STRL SS (BLADE) ×2
CANNULA REDUC XI 12-8 STAPL (CANNULA) ×2
CANNULA REDUCER 12-8 DVNC XI (CANNULA) ×1 IMPLANT
DERMABOND ADVANCED (GAUZE/BANDAGES/DRESSINGS) ×1
DERMABOND ADVANCED .7 DNX12 (GAUZE/BANDAGES/DRESSINGS) ×1 IMPLANT
DRAPE 3/4 80X56 (DRAPES) ×1 IMPLANT
DRAPE ARM DVNC X/XI (DISPOSABLE) ×4 IMPLANT
DRAPE COLUMN DVNC XI (DISPOSABLE) ×1 IMPLANT
DRAPE DA VINCI XI ARM (DISPOSABLE) ×8
DRAPE DA VINCI XI COLUMN (DISPOSABLE) ×2
ELECT CAUTERY BLADE 6.4 (BLADE) ×2 IMPLANT
ELECT REM PT RETURN 9FT ADLT (ELECTROSURGICAL) ×2
ELECTRODE REM PT RTRN 9FT ADLT (ELECTROSURGICAL) ×1 IMPLANT
GLOVE SURG ENC MOIS LTX SZ7 (GLOVE) ×11 IMPLANT
GOWN STRL REUS W/ TWL LRG LVL3 (GOWN DISPOSABLE) ×4 IMPLANT
GOWN STRL REUS W/TWL LRG LVL3 (GOWN DISPOSABLE) ×8
GRASPER LAPSCPC 5X45 DSP (INSTRUMENTS) ×3 IMPLANT
IRRIGATION STRYKERFLOW (MISCELLANEOUS) IMPLANT
IRRIGATOR STRYKERFLOW (MISCELLANEOUS) ×2
IV NS 1000ML (IV SOLUTION)
IV NS 1000ML BAXH (IV SOLUTION) IMPLANT
KIT PINK PAD W/HEAD ARE REST (MISCELLANEOUS) ×2
KIT PINK PAD W/HEAD ARM REST (MISCELLANEOUS) ×1 IMPLANT
KIT TURNOVER CYSTO (KITS) ×1 IMPLANT
LABEL OR SOLS (LABEL) ×2 IMPLANT
MANIFOLD NEPTUNE II (INSTRUMENTS) ×2 IMPLANT
MESH BIO-A 7X10 SYN MAT (Mesh General) ×1 IMPLANT
NEEDLE HYPO 22GX1.5 SAFETY (NEEDLE) ×2 IMPLANT
OBTURATOR OPTICAL STANDARD 8MM (TROCAR) ×2
OBTURATOR OPTICAL STND 8 DVNC (TROCAR) ×1
OBTURATOR OPTICALSTD 8 DVNC (TROCAR) ×1 IMPLANT
PACK LAP CHOLECYSTECTOMY (MISCELLANEOUS) ×2 IMPLANT
PENCIL ELECTRO HAND CTR (MISCELLANEOUS) ×2 IMPLANT
SEAL CANN UNIV 5-8 DVNC XI (MISCELLANEOUS) ×3 IMPLANT
SEAL XI 5MM-8MM UNIVERSAL (MISCELLANEOUS) ×6
SEALER VESSEL DA VINCI XI (MISCELLANEOUS) ×2
SEALER VESSEL EXT DVNC XI (MISCELLANEOUS) ×1 IMPLANT
SOLUTION ELECTROLUBE (MISCELLANEOUS) ×2 IMPLANT
SPIKE FLUID TRANSFER (MISCELLANEOUS) ×2 IMPLANT
SPONGE T-LAP 18X18 ~~LOC~~+RFID (SPONGE) ×2 IMPLANT
STAPLER CANNULA SEAL DVNC XI (STAPLE) ×1 IMPLANT
STAPLER CANNULA SEAL XI (STAPLE) ×2
SUT MNCRL 4-0 (SUTURE) ×2
SUT MNCRL 4-0 27XMFL (SUTURE) ×1
SUT SILK 2 0 SH (SUTURE) ×6 IMPLANT
SUT VIC AB 3-0 SH 27 (SUTURE) ×2
SUT VIC AB 3-0 SH 27X BRD (SUTURE) IMPLANT
SUT VICRYL 0 AB UR-6 (SUTURE) ×4 IMPLANT
SUT VLOC 90 S/L VL9 GS22 (SUTURE) ×2 IMPLANT
SUTURE MNCRL 4-0 27XMF (SUTURE) ×1 IMPLANT
SYR 20ML LL LF (SYRINGE) ×2 IMPLANT
SYR 30ML LL (SYRINGE) ×2 IMPLANT
SYS BAG RETRIEVAL 10MM (BASKET) ×1
SYSTEM BAG RETRIEVAL 10MM (BASKET) IMPLANT
TAPE TRANSPORE STRL 2 31045 (GAUZE/BANDAGES/DRESSINGS) IMPLANT
TRAY FOLEY SLVR 16FR LF STAT (SET/KITS/TRAYS/PACK) ×2 IMPLANT
TROCAR BALLN GELPORT 12X130M (ENDOMECHANICALS) ×2 IMPLANT
TROCAR XCEL NON-BLD 5MMX100MML (ENDOMECHANICALS) ×2 IMPLANT
TUBING EVAC SMOKE HEATED PNEUM (TUBING) ×2 IMPLANT
WATER STERILE IRR 500ML POUR (IV SOLUTION) ×1 IMPLANT

## 2022-02-03 NOTE — Anesthesia Procedure Notes (Signed)
Procedure Name: Intubation ?Date/Time: 02/03/2022 7:42 AM ?Performed by: Berniece Pap, CRNA ?Pre-anesthesia Checklist: Patient identified, Emergency Drugs available, Suction available and Patient being monitored ?Patient Re-evaluated:Patient Re-evaluated prior to induction ?Oxygen Delivery Method: Circle system utilized ?Preoxygenation: Pre-oxygenation with 100% oxygen ?Induction Type: IV induction ?Ventilation: Mask ventilation without difficulty ?Laryngoscope Size: McGraph and 4 ?Grade View: Grade II ?Tube type: Oral ?Tube size: 7.5 mm ?Number of attempts: 1 ?Airway Equipment and Method: Stylet and Oral airway ?Placement Confirmation: ETT inserted through vocal cords under direct vision, positive ETCO2 and breath sounds checked- equal and bilateral ?Secured at: 21 cm ?Tube secured with: Tape ?Dental Injury: Teeth and Oropharynx as per pre-operative assessment  ? ? ? ? ?

## 2022-02-03 NOTE — Op Note (Signed)
Robotic assisted laparoscopic  repair of paraesophageal hernia with Bio-A Mesh and Nissen fundoplication  ? ?Pre-operative Diagnosis: GERD, Type III paraesophageal hernia ? ?Post-operative Diagnosis: same ? ?Procedure:  ?Robotic assisted laparoscopic  repair of paraesophageal hernia with Bio-A Mesh and Nissen fundoplication  ? ?Surgeon: Sterling Big, MD FACS ? ?Assistant: Sonda Rumble RNFA. Required due to the complexity of the case the need for exposure and lack of first assist. ? ?Anesthesia: Gen. with endotracheal tube ? ?Findings: ?Type III paraesophageal hernia with at least 50% of the stomach within the mediastinum ?Loose wrap 360 degree over 50 FR Bougie ? ?Estimated Blood Loss: 15cc ?      ?Specimens: sac ?      ?Complications: none ? ? ?Procedure Details  ?The patient was seen again in the Holding Room. The benefits, complications, treatment options, and expected outcomes were discussed with the patient. The risks of bleeding, infection, recurrence of symptoms, failure to resolve symptoms,  esophageal damage, Dysphagia, bowel injury, any of which could require further surgery were reviewed with the patient. The likelihood of improving the patient's symptoms with return to their baseline status is good.  The patient and/or family concurred with the proposed plan, giving informed consent.  The patient was taken to Operating Room, identified  and the procedure verified. ? A Time Out was held and the above information confirmed. ? ?Prior to the induction of general anesthesia, antibiotic prophylaxis was administered. VTE prophylaxis was in place. General endotracheal anesthesia was then administered and tolerated well. After the induction, the abdomen was prepped with Chloraprep and draped in the sterile fashion. The patient was positioned in the supine position. ? ?Cut down technique was used to enter the abdominal cavity and a Hasson trochar was placed after two vicryl stitches were anchored to the fascia.  Pneumoperitoneum was then created with CO2 and tolerated well without any adverse changes in the patient's vital signs.  Three 8-mm ports were placed under direct vision. All skin incisions  were infiltrated with a local anesthetic agent before making the incision and placing the trocars. An additional 5 mm regular laparoscopic port was placed to assist with retraction and exposure.  ? ?The patient was positioned  in reverse Trendelenburg, robot was brought to the surgical field and docked in the standard fashion.  We made sure all the instrumentation was kept indirect view at all times and that there were no collision between the arms. ?I scrubbed out and went to the console. ? ?I used a attic arm to retract the liver, the vessel sealer on my right hand and a forced bipolar grasper on my left hand.  There is along the extra 5 mm port allow me ample exposure and the ability to perform meticulous dissection ? ?We Started dividing the lesser omentum via the pars flaccida.  We Were able to dissect the lesser curvature of the stomach and  dissected the fundus free from the right and left crus.  We circumferentially dissected the GE junction.  The hernia sac was also completely reduced and we were able to bring the stomach into the intra-abdominal position.  Attention then was turned to the greater curvature where the short gastrics were divided with sealer device.  We were able to identify the left crus and again were able to make sure there was a good circumferential dissection and that the hernia sac was completely excised.  We did perform a good dissection within the mediastinum to allow a complete reduction of the sac and  a to completely allow an intra-abdominal Nissen fundoplication. The hernia sac was excised.We did have some transient drop in O2 sats, I decreased the insufflation and there was significant improvement. ?Two strips of bio-A were cut and placed on each side of the crus 2-0V lock suture was inserted  and the crus  was closed with a running suture. Bio-A mesh placed and secures with vistaseal in the standard fashion. There was good coverage and the mesh layed very well. ?  ?We Asked anesthesia to place a 50 French bougie and this went easily.  We also observe trajectory of the bougie. ?360 degree Nissen fundoplication was created with multiple 2-0 silk sutures and we placed 3 stitches taking some of the esophagus within that bite.  The fundoplication measured approximately 3-1/2 cm and he was floppy. ?I was very happy with the way the fundoplication laid and the repair of the hernia. ? ?Inspection of the  upper quadrant was performed. No bleeding, bile  Or esophageal injuries leaks, or bowel injuries were noted. ?Robotic instruments and robotic arms were undocked in the standard fashion. All the needles were removed under direct visualization.   ?I scrubbed back in. ? ?Pneumoperitoneum was released.  The periumbilical port site was closed with interrumpted 0 Vicryl sutures. 4-0 subcuticular Monocryl was used to close the skin. Liposomal marcaine was injected to all the incisions sites. ? ?Dermabond was  applied.  The patient was then extubated and brought to the recovery room in stable condition. Sponge, lap, and needle counts were correct at closure and at the conclusion of the case.  ?        ?     ?Sterling Big, MD, FACS  ?

## 2022-02-03 NOTE — Transfer of Care (Signed)
Immediate Anesthesia Transfer of Care Note ? ?Patient: Troy [name] ? ?Procedure(s) Performed: XI ROBOTIC ASSISTED PARAESOPHAGEAL HERNIA REPAIR with RNFA to assist ? ?Patient Location: PACU ? ?Anesthesia Type:General ? ?Level of Consciousness: awake, alert  and oriented ? ?Airway & Oxygen Therapy: Patient Spontanous Breathing and Patient connected to face mask oxygen ? ?Post-op Assessment: Report given to RN and Post -op Vital signs reviewed and stable ? ?Post vital signs: Reviewed and stable ? ?Last Vitals:  ?Vitals Value Taken Time  ?BP 165/87 02/03/22 1045  ?Temp    ?Pulse 77 02/03/22 1046  ?Resp 17 02/03/22 1046  ?SpO2 100 % 02/03/22 1046  ?Vitals shown include unvalidated device data. ? ?Last Pain:  ?Vitals:  ? 02/03/22 0630  ?TempSrc: Oral  ?   ? ?  ? ?Complications: No notable events documented. ?

## 2022-02-03 NOTE — Anesthesia Preprocedure Evaluation (Signed)
Anesthesia Evaluation  ?Patient identified by MRN, date of birth, ID band ?Patient awake ? ? ? ?Reviewed: ?Allergy & Precautions, NPO status , Patient's Chart, lab work & pertinent test results ? ?History of Anesthesia Complications ?Negative for: history of anesthetic complications ? ?Airway ?Mallampati: III ? ?TM Distance: <3 FB ?Neck ROM: full ? ? ? Dental ? ?(+) Chipped, Poor Dentition ?  ?Pulmonary ?neg shortness of breath,  ?  ?Pulmonary exam normal ? ? ? ? ? ? ? Cardiovascular ?Exercise Tolerance: Good ?hypertension, (-) angina(-) Past MI and (-) DOE Normal cardiovascular exam ? ? ?  ?Neuro/Psych ?negative neurological ROS ? negative psych ROS  ? GI/Hepatic ?Neg liver ROS, hiatal hernia, GERD  Controlled,  ?Endo/Other  ?negative endocrine ROS ? Renal/GU ?  ? ?  ?Musculoskeletal ? ? Abdominal ?  ?Peds ? Hematology ?negative hematology ROS ?(+)   ?Anesthesia Other Findings ?Past Medical History: ?No date: Alcohol dependence (HCC) ?No date: Anxiety ?No date: Chest pain ?No date: GERD (gastroesophageal reflux disease) ?No date: Hypertension ?No date: Insomnia ?11/25/2016: Iron deficiency anemia ?No date: Plantar fascial fibromatosis ?No date: Psoriasis ? ?Past Surgical History: ?12/01/2016: BIOPSY ?    Comment:  Procedure: BIOPSY;  Surgeon: West Bali, MD;   ?             [facility];  Service: Endoscopy;;   ?             gastric ?duodenal ?hiatal hernia pouch ?12/01/2016: COLONOSCOPY WITH PROPOFOL; N/A ?    Comment:  Procedure: COLONOSCOPY WITH PROPOFOL;  Surgeon: Duncan Dull L  ?             Fields, MD;  Location: [facility];  Service:  ?             Endoscopy;  Laterality: N/A;  1:45 pm ?2004: ESOPHAGOGASTRODUODENOSCOPY ?    Comment:  Dr. 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             unknown path  ?12/01/2016: ESOPHAGOGASTRODUODENOSCOPY (EGD) WITH PROPOFOL; N/A ?    Comment:  Procedure: ESOPHAGOGASTRODUODENOSCOPY (EGD) WITH  ?             PROPOFOL;   Surgeon: West Bali, MD;  Location: [facility]  ?             [facility];  Service: Endoscopy;  Laterality: N/A; ?12/31/2021: ESOPHAGOGASTRODUODENOSCOPY (EGD) WITH PROPOFOL; N/A ?    Comment:  Procedure: ESOPHAGOGASTRODUODENOSCOPY (EGD) WITH  ?             PROPOFOL;  Surgeon: Wyline Mood, MD;  Location: [facility]  ?             ENDOSCOPY;  Service: Gastroenterology;  Laterality: N/A; ?12/23/2016: GIVENS CAPSULE STUDY; N/A ?    Comment:  Procedure: GIVENS CAPSULE STUDY;  Surgeon: Duncan Dull L  ?             Fields, MD;  [facility];  Service:  ?             Endoscopy;  Laterality: N/A;  arrive at 7:00am for 7:30am ?             appt time ?No date: HEMORRHOID SURGERY ?    Comment:  residual skin tags ?No date: right ankle ?06/07/2014: UMBILICAL HERNIA REPAIR; N/A ?    Comment:  Procedure: HERNIA REPAIR UMBILICAL ADULT;  Surgeon:  ?  Marlane Hatcher, MD;  Location: [facility];  Service:  ?             General;  Laterality: N/A; ? ?BMI   ? Body Mass Index: 26.19 kg/m?  ?  ? ? Reproductive/Obstetrics ?negative OB ROS ? ?  ? ? ? ? ? ? ? ? ? ? ? ? ? ?  ?  ? ? ? ? ? ? ? ? ?Anesthesia Physical ?Anesthesia Plan ? ?ASA: 3 ? ?Anesthesia Plan: General ETT  ? ?Post-op Pain Management:   ? ?Induction: Intravenous ? ?PONV Risk Score and Plan: Ondansetron, Dexamethasone, Midazolam and Treatment may vary due to age or medical condition ? ?Airway Management Planned: Oral ETT ? ?Additional Equipment:  ? ?Intra-op Plan:  ? ?Post-operative Plan: Extubation in OR ? ?Informed Consent: I have reviewed the patients History and Physical, chart, labs and discussed the procedure including the risks, benefits and alternatives for the proposed anesthesia with the patient or authorized representative who has indicated his/her understanding and acceptance.  ? ? ? ?Dental Advisory Given ? ?Plan Discussed with: Anesthesiologist, CRNA and Surgeon ? ?Anesthesia Plan Comments: (Patient hypertensive in the PreOp area, he reports that he did not take  his anti hypertensive's today.  No headache to chest pain.  Consented for risks of proceeding with anesthetic and the patient voiced assent. Plan to treat the patients hypertension in the OR. ? ?Patient consented for risks of anesthesia including but not limited to:  ?- adverse reactions to medications ?- damage to eyes, teeth, lips or other oral mucosa ?- nerve damage due to positioning  ?- sore throat or hoarseness ?- Damage to heart, brain, nerves, lungs, other parts of body or loss of life ? ?Patient voiced understanding.)  ? ? ? ? ? ? ?Anesthesia Quick Evaluation ? ?

## 2022-02-03 NOTE — Anesthesia Postprocedure Evaluation (Signed)
Anesthesia Post Note ? ?Patient: Troy [name] ? ?Procedure(s) Performed: XI ROBOTIC ASSISTED PARAESOPHAGEAL HERNIA REPAIR with RNFA to assist ? ?Patient location during evaluation: PACU ?Anesthesia Type: General ?Level of consciousness: awake and alert ?Pain management: pain level controlled ?Vital Signs Assessment: post-procedure vital signs reviewed and stable ?Respiratory status: spontaneous breathing, nonlabored ventilation, respiratory function stable and patient connected to nasal cannula oxygen ?Cardiovascular status: blood pressure returned to baseline and stable ?Postop Assessment: no apparent nausea or vomiting ?Anesthetic complications: no ? ? ?No notable events documented. ? ? ?Last Vitals:  ?Vitals:  ? 02/03/22 1225 02/03/22 1230  ?BP:  (!) 157/85  ?Pulse: 65 (!) 38  ?Resp: 13 19  ?Temp: 37 ?C   ?SpO2: 93% 94%  ?  ?Last Pain:  ?Vitals:  ? 02/03/22 1230  ?TempSrc:   ?PainSc: 4   ? ? ?  ?  ?  ?  ?  ?  ? ?Cleda Mccreedy Shanta Hartner ? ? ? ? ?

## 2022-02-03 NOTE — Interval H&P Note (Signed)
History and Physical Interval Note: ? ?02/03/2022 ?7:19 AM ? ?Troy [name]  has presented today for surgery, with the diagnosis of paraesophageal hernia.  The various methods of treatment have been discussed with the patient and family. After consideration of risks, benefits and other options for treatment, the patient has consented to  Procedure(s): ?XI ROBOTIC ASSISTED PARAESOPHAGEAL HERNIA REPAIR with RNFA to assist (N/A) as a surgical intervention.  The patient's history has been reviewed, patient examined, no change in status, stable for surgery.  I have reviewed the patient's chart and labs.  Questions were answered to the patient's satisfaction.   ? ? ?Troy [name] ? ? ?

## 2022-02-04 ENCOUNTER — Encounter: Payer: Self-pay | Admitting: Surgery

## 2022-02-04 ENCOUNTER — Telehealth: Payer: Self-pay

## 2022-02-04 DIAGNOSIS — K449 Diaphragmatic hernia without obstruction or gangrene: Secondary | ICD-10-CM | POA: Diagnosis not present

## 2022-02-04 LAB — BASIC METABOLIC PANEL
Anion gap: 3 — ABNORMAL LOW (ref 5–15)
BUN: 16 mg/dL (ref 6–20)
CO2: 29 mmol/L (ref 22–32)
Calcium: 8.6 mg/dL — ABNORMAL LOW (ref 8.9–10.3)
Chloride: 105 mmol/L (ref 98–111)
Creatinine, Ser: 1.02 mg/dL (ref 0.61–1.24)
GFR, Estimated: >60 mL/min (ref >60)
Glucose, Bld: 150 mg/dL — ABNORMAL HIGH (ref 70–99)
Potassium: 4.4 mmol/L (ref 3.5–5.1)
Sodium: 137 mmol/L (ref 135–145)

## 2022-02-04 LAB — CBC
HCT: 40.4 % (ref 39.0–52.0)
Hemoglobin: 13.3 g/dL (ref 13.0–17.0)
MCH: 29 pg (ref 26.0–34.0)
MCHC: 32.9 g/dL (ref 30.0–36.0)
MCV: 88 fL (ref 80.0–100.0)
Platelets: 168 10*3/uL (ref 150–400)
RBC: 4.59 MIL/uL (ref 4.22–5.81)
RDW: 12.7 % (ref 11.5–15.5)
WBC: 7 10*3/uL (ref 4.0–10.5)
nRBC: 0 % (ref 0.0–0.2)

## 2022-02-04 LAB — SURGICAL PATHOLOGY

## 2022-02-04 MED ORDER — ONDANSETRON 4 MG PO TBDP
4.0000 mg | ORAL_TABLET | Freq: Four times a day (QID) | ORAL | 0 refills | Status: AC | PRN
Start: 2022-02-04 — End: ?

## 2022-02-04 MED ORDER — OXYCODONE HCL 5 MG PO TABS: 5.0000 mg | ORAL_TABLET | Freq: Four times a day (QID) | ORAL | 0 refills | Status: DC | PRN

## 2022-02-04 MED ORDER — IBUPROFEN 600 MG PO TABS: 600.0000 mg | ORAL_TABLET | Freq: Four times a day (QID) | ORAL | 0 refills | Status: AC | PRN

## 2022-02-04 NOTE — Discharge Summary (Addendum)
[name] ?SURGICAL DISCHARGE SUMMARY ? ?Patient ID: ?Troy [name] ?MRN: [id number] ?DOB/AGE: 07-01-1961 61 y.o. ? ?Admit date: 02/03/2022 ?Discharge date: 02/04/2022 ? ?Discharge Diagnoses ?Patient Active Problem List  ? Diagnosis Date Noted  ? S/P repair of paraesophageal hernia 02/03/2022  ? ? ?Consultants ?None ? ?Procedures ?02/03/2022:  ?Robotic assisted laparoscopic  repair of paraesophageal hernia with Bio-A Mesh and Nissen fundoplication  ? ?HPI: Troy [name] is a 61 y.o. male with large paraesophageal hernia who presents to [facility] on 03/21 for scheduled repair with Dr Everlene Farrier.  ? ?Hospital Course: Informed consent was obtained and documented, and patient underwent uneventful robotic assisted laparoscopic  repair of paraesophageal hernia with Bio-A Mesh and Nissen fundoplication  (Dr Everlene Farrier, 02/03/2022).  Post-operatively, patient did well and advancement of patient's diet and ambulation were well-tolerated. The remainder of patient's hospital course was essentially unremarkable, and discharge planning was initiated accordingly with patient safely able to be discharged home with appropriate discharge instructions, pain control, and outpatient follow-up after all of his and his wife's questions were answered to their expressed satisfaction.  ? ?Discharge Condition: Good ? ? ?Physical Examination:  ?Constitutional: Well appearing male, NAD ?Pulmonary: Normal effort, no respiratory distress ?Gastrointestinal: Soft, incisional soreness, non-distended, no rebound/guarding ?Skin: Laparoscopic incisions are CDI with dermabond, no erythema or drainage  ? ? ?Allergies as of 02/04/2022   ?No Known Allergies ?  ? ?  ?Medication List  ?  ? ?TAKE these medications   ? ?ibuprofen 600 MG tablet ?Commonly known as: ADVIL ?Take 1 tablet (600 mg total) by mouth every 6 (six) hours as needed. ?  ?lisinopril 20 MG tablet ?Commonly known as: ZESTRIL ?Take 20 mg by mouth 2 (two) times daily. ?  ?ondansetron 4 MG  disintegrating tablet ?Commonly known as: ZOFRAN-ODT ?Take 1 tablet (4 mg total) by mouth every 6 (six) hours as needed for nausea. ?  ?oxyCODONE 5 MG immediate release tablet ?Commonly known as: Oxy IR/ROXICODONE ?Take 1 tablet (5 mg total) by mouth every 6 (six) hours as needed for severe pain or breakthrough pain. ?  ?sildenafil 100 MG tablet ?Commonly known as: VIAGRA ?Take 100 mg by mouth as needed for erectile dysfunction. ?  ? ?  ? ? ? ? Follow-up Information   ? ? Leafy Ro, MD. Nyra Capes on 02/18/2022.   ?Specialty: General Surgery ?Why: S/P Paraesophagela Hernia repair 2:00pm appointment ?Contact information: ?[address] ?[address] ?[place] [address] ?[phone] ? ? ?  ?  ? ?  ?  ? ?  ? ? ? ?Time spent on discharge management including discussion of hospital course, clinical condition, outpatient instructions, prescriptions, and follow up with the patient and members of the medical team: >30 minutes ? ?-- ?Lynden Oxford , PA-C ?[facility]  ?02/04/2022, 9:35 AM ?[phone] ?M-F: 7am - 4pm ? ?

## 2022-02-04 NOTE — TOC Initial Note (Signed)
Transition of Care (TOC) - Initial/Assessment Note  ? ? ?Patient Details  ?Name: Troy [name] ?MRN: [id number] ?Date of Birth: [birth year] ? ?Transition of Care (TOC) CM/SW Contact:    ?Chapman Fitch, RN ?Phone Number: ?02/04/2022, 9:44 AM ? ?Clinical Narrative:                 ? ? ?  ?Transition of Care (TOC) Screening Note ? ? ?Patient Details  ?Name: Troy [name] ?Date of Birth: Nov 30, 1960 ? ? ?Transition of Care (TOC) CM/SW Contact:    ?Chapman Fitch, RN ?Phone Number: ?02/04/2022, 9:44 AM ? ? ? ?Transition of Care Department [facility]) has reviewed patient and no TOC needs have been identified at this time. We will continue to monitor patient advancement through interdisciplinary progression rounds. If new patient transition needs arise, please place a TOC consult. ? ? ?  ? ? ?Patient Goals and CMS Choice ?  ?  ?  ? ?Expected Discharge Plan and Services ?  ?  ?  ?  ?  ?Expected Discharge Date: 02/04/22               ?  ?  ?  ?  ?  ?  ?  ?  ?  ?  ? ?Prior Living Arrangements/Services ?  ?  ?  ?       ?  ?  ?  ?  ? ?Activities of Daily Living ?Home Assistive Devices/Equipment: None ?ADL Screening (condition at time of admission) ?Patient's cognitive ability adequate to safely complete daily activities?: Yes ?Is the patient deaf or have difficulty hearing?: No ?Does the patient have difficulty seeing, even when wearing glasses/contacts?: No ?Does the patient have difficulty concentrating, remembering, or making decisions?: No ?Patient able to express need for assistance with ADLs?: Yes ?Does the patient have difficulty dressing or bathing?: No ?Independently performs ADLs?: Yes (appropriate for developmental age) ?Does the patient have difficulty walking or climbing stairs?: No ?Weakness of Legs: None ?Weakness of Arms/Hands: None ? ?Permission Sought/Granted ?  ?  ?   ?   ?   ?   ? ?Emotional Assessment ?  ?  ?  ?  ?  ?  ? ?Admission diagnosis:  S/P repair of paraesophageal hernia [E08.144, Z87.19] ?Patient Active  Problem List  ? Diagnosis Date Noted  ? S/P repair of paraesophageal hernia 02/03/2022  ? External hemorrhoid, bleeding 12/30/2021  ? Hiatal hernia 12/30/2021  ? Low serum vitamin B12 03/10/2017  ? Iron deficiency anemia 11/25/2016  ? Umbilical hernia, incarcerated 06/07/2014  ? ?PCP:  Gareth Morgan, MD ?Pharmacy:   ?[facility] - [place], [place] - [address] ?[address] ?[place] [address] ?Phone: [phone] Fax: [phone] ? ? ? ? ?Social Determinants of Health (SDOH) Interventions ?  ? ?Readmission Risk Interventions ?   ? View : No data to display.  ?  ?  ?  ? ? ? ?

## 2022-02-04 NOTE — Telephone Encounter (Signed)
Patient sent mychart message and cancel appointment for 02/03/22 for banding 2. Can you please call patient to reschedule appointment where you want him to go. I would call him today but he is having surgery on Hernia this morning.  ? Appointment Request From: Troy [name] ?  ?With Provider: Wyline Mood, MD [facility]] ?  ?Preferred Date Range: 01/28/2022 - 01/30/2022 ?  ?Preferred Times: Any Time ?  ?Reason for visit: Office Visit ?  ?Comments: ?I keep getting your voice mail,  left a message this morning but haven't received a reply.  Need to reschedule the 3/21 @ 2pm appointment to this week,  because I have a  conflict next week.  Please call [phone]. Thanks. ?

## 2022-02-04 NOTE — Progress Notes (Signed)
Seen and examined he is doing well. Abd benign and ready for DC. No reflux ?

## 2022-02-04 NOTE — Discharge Instructions (Signed)
In addition to included general post-operative instructions, ? ?Diet: Follow Nissen diet recommendations (handouts provided) for 4 weeks.  ? ?Activity: No heavy lifting >20 pounds (children, pets, laundry, garbage) for 4 weeks, but light activity and walking are encouraged. Do not drive or drink alcohol if taking narcotic pain medications or having pain that might distract from driving. ? ?Wound care: 2 days after surgery (03/23), you may shower/get incision wet with soapy water and pat dry (do not rub incisions), but no baths or submerging incision underwater until follow-up.  ? ?Medications: Resume all home medications. For mild to moderate pain: acetaminophen (Tylenol) or ibuprofen/naproxen (if no kidney disease). Combining Tylenol with alcohol can substantially increase your risk of causing liver disease. Narcotic pain medications, if prescribed, can be used for severe pain, though may cause nausea, constipation, and drowsiness. Do not combine Tylenol and Percocet (or similar) within a 6 hour period as Percocet (and similar) contain(s) Tylenol. If you do not need the narcotic pain medication, you do not need to fill the prescription. ? ?Call office [phone] / [phone]) at any time if any questions, worsening pain, fevers/chills, bleeding, drainage from incision site, or other concerns. ? ?

## 2022-02-04 NOTE — Telephone Encounter (Signed)
Called patient back and he stated that he would give [place] a call back after he sees Dr. Everlene Farrier for his post-op appointment and then he will let us know when to reschedule his Banding # 2. ?

## 2022-02-05 ENCOUNTER — Ambulatory Visit: Payer: BC Managed Care – PPO | Admitting: Gastroenterology

## 2022-02-16 ENCOUNTER — Ambulatory Visit: Payer: BC Managed Care – PPO | Admitting: Surgery

## 2022-02-18 ENCOUNTER — Ambulatory Visit (INDEPENDENT_AMBULATORY_CARE_PROVIDER_SITE_OTHER): Payer: BC Managed Care – PPO | Admitting: Surgery

## 2022-02-18 ENCOUNTER — Encounter: Payer: Self-pay | Admitting: Surgery

## 2022-02-18 VITALS — BP 186/116 | HR 83 | Temp 98.7°F | Ht 74.0 in | Wt 197.6 lb

## 2022-02-18 DIAGNOSIS — K449 Diaphragmatic hernia without obstruction or gangrene: Secondary | ICD-10-CM

## 2022-02-18 DIAGNOSIS — Z09 Encounter for follow-up examination after completed treatment for conditions other than malignant neoplasm: Secondary | ICD-10-CM

## 2022-02-18 NOTE — Patient Instructions (Addendum)
If you have any concerns or questions, please feel free to call our office. See follow up appointments below.  ? ?GENERAL POST-OPERATIVE ?PATIENT INSTRUCTIONS  ? ?WOUND CARE INSTRUCTIONS:  Keep a dry clean dressing on the wound if there is drainage. The initial bandage may be removed after 24 hours.  Once the wound has quit draining you may leave it open to air.  If clothing rubs against the wound or causes irritation and the wound is not draining you may cover it with a dry dressing during the daytime.  Try to keep the wound dry and avoid ointments on the wound unless directed to do so.  If the wound becomes bright red and painful or starts to drain infected material that is not clear, please contact your physician immediately.  If the wound is mildly pink and has a thick firm ridge underneath it, this is normal, and is referred to as a healing ridge.  This will resolve over the next 4-6 weeks. ? ?BATHING: ?You may shower if you have been informed of this by your surgeon. However, Please do not submerge in a tub, hot tub, or pool until incisions are completely sealed or have been told by your surgeon that you may do so. ? ?DIET:  You may eat any foods that you can tolerate.  It is a good idea to eat a high fiber diet and take in plenty of fluids to prevent constipation.  If you do become constipated you may want to take a mild laxative or take ducolax tablets on a daily basis until your bowel habits are regular.  Constipation can be very uncomfortable, along with straining, after recent surgery. ? ?ACTIVITY:  You are encouraged to cough and deep breath or use your incentive spirometer if you were given one, every 15-30 minutes when awake.  This will help prevent respiratory complications and low grade fevers post-operatively if you had a general anesthetic.  You may want to hug a pillow when coughing and sneezing to add additional support to the surgical area, if you had abdominal or chest surgery, which will  decrease pain during these times.  You are encouraged to walk and engage in light activity for the next two weeks.  You should not lift, pull or push more than 20 pounds for 6 weeks total after surgery as it could put you at increased risk for complications.  Twenty pounds is roughly equivalent to a plastic bag of groceries. At that time- Listen to your body when lifting, if you have pain when lifting, stop and then try again in a few days. Soreness after doing exercises or activities of daily living is normal as you get back in to your normal routine. ? ?MEDICATIONS:  Try to take narcotic medications and anti-inflammatory medications, such as tylenol, ibuprofen, naprosyn, etc., with food.  This will minimize stomach upset from the medication.  Should you develop nausea and vomiting from the pain medication, or develop a rash, please discontinue the medication and contact your physician.  You should not drive, make important decisions, or operate machinery when taking narcotic pain medication. ? ?SUNBLOCK ?Use sun block to incision area over the next year if this area will be exposed to sun. This helps decrease scarring and will allow you avoid a permanent darkened area over your incision. ? ?QUESTIONS:  Please feel free to call our office if you have any questions, and we will be glad to assist you. [phone] ? ? ? ? ?

## 2022-02-20 NOTE — Progress Notes (Signed)
Is following up after paraesophageal hernia repair 2 weeks ago.  He is doing well ?Reflux is nonexistent.  He does have some intermittent abdominal pain that is expected postoperatively.  Taking p.o.  No fevers no chills ambulating.  He actually drove himself from [facility] today ? ? ?PE NAD ?Abdomen: Soft incisions healing well without infection or peritonitis ? ?A/P doing well.  Return to clinic in a month or so. ?Advised about following Nissen diet\ ?No complicaitons ?

## 2022-03-06 ENCOUNTER — Inpatient Hospital Stay (HOSPITAL_COMMUNITY): Payer: BC Managed Care – PPO | Attending: Hematology

## 2022-03-06 DIAGNOSIS — E538 Deficiency of other specified B group vitamins: Secondary | ICD-10-CM | POA: Diagnosis present

## 2022-03-06 DIAGNOSIS — E559 Vitamin D deficiency, unspecified: Secondary | ICD-10-CM | POA: Insufficient documentation

## 2022-03-06 DIAGNOSIS — D500 Iron deficiency anemia secondary to blood loss (chronic): Secondary | ICD-10-CM

## 2022-03-06 DIAGNOSIS — D508 Other iron deficiency anemias: Secondary | ICD-10-CM | POA: Diagnosis present

## 2022-03-06 LAB — CBC WITH DIFFERENTIAL/PLATELET
Abs Immature Granulocytes: 0.01 10*3/uL (ref 0.00–0.07)
Basophils Absolute: 0.1 10*3/uL (ref 0.0–0.1)
Basophils Relative: 1 %
Eosinophils Absolute: 0.2 10*3/uL (ref 0.0–0.5)
Eosinophils Relative: 4 %
HCT: 47.1 % (ref 39.0–52.0)
Hemoglobin: 15 g/dL (ref 13.0–17.0)
Immature Granulocytes: 0 %
Lymphocytes Relative: 36 %
Lymphs Abs: 1.4 10*3/uL (ref 0.7–4.0)
MCH: 28.7 pg (ref 26.0–34.0)
MCHC: 31.8 g/dL (ref 30.0–36.0)
MCV: 90.2 fL (ref 80.0–100.0)
Monocytes Absolute: 0.3 10*3/uL (ref 0.1–1.0)
Monocytes Relative: 7 %
Neutro Abs: 2.1 10*3/uL (ref 1.7–7.7)
Neutrophils Relative %: 52 %
Platelets: 198 10*3/uL (ref 150–400)
RBC: 5.22 MIL/uL (ref 4.22–5.81)
RDW: 13 % (ref 11.5–15.5)
WBC: 4 10*3/uL (ref 4.0–10.5)
nRBC: 0 % (ref 0.0–0.2)

## 2022-03-06 LAB — IRON AND TIBC
Iron: 119 ug/dL (ref 45–182)
Saturation Ratios: 28 % (ref 17.9–39.5)
TIBC: 422 ug/dL (ref 250–450)
UIBC: 303 ug/dL

## 2022-03-06 LAB — VITAMIN D 25 HYDROXY (VIT D DEFICIENCY, FRACTURES): Vit D, 25-Hydroxy: 36.38 ng/mL (ref 30–100)

## 2022-03-06 LAB — VITAMIN B12: Vitamin B-12: 189 pg/mL (ref 180–914)

## 2022-03-06 LAB — FERRITIN: Ferritin: 41 ng/mL (ref 24–336)

## 2022-03-09 LAB — METHYLMALONIC ACID, SERUM: Methylmalonic Acid, Quantitative: 201 nmol/L (ref 0–378)

## 2022-03-10 ENCOUNTER — Encounter: Payer: Self-pay | Admitting: Physician Assistant

## 2022-03-10 ENCOUNTER — Other Ambulatory Visit: Payer: Self-pay

## 2022-03-10 ENCOUNTER — Ambulatory Visit (INDEPENDENT_AMBULATORY_CARE_PROVIDER_SITE_OTHER): Payer: BC Managed Care – PPO | Admitting: Physician Assistant

## 2022-03-10 VITALS — BP 182/136 | HR 73 | Temp 98.5°F | Ht 74.0 in | Wt 196.6 lb

## 2022-03-10 DIAGNOSIS — Z09 Encounter for follow-up examination after completed treatment for conditions other than malignant neoplasm: Secondary | ICD-10-CM

## 2022-03-10 DIAGNOSIS — K449 Diaphragmatic hernia without obstruction or gangrene: Secondary | ICD-10-CM

## 2022-03-10 NOTE — Progress Notes (Signed)
[facility] ?POST-OP OFFICE VISIT ? ?03/10/2022 ? ?HPI: ?Troy [name] is a 61 y.o. male 35 days s/p robotic assisted laparoscopic paraesophageal hernia repair and Nissen Fundoplication with Dr Dahlia Byes.  ? ?He has done well ?He has gradually re-introduced diet; he has some bloating with certain foods and mild reflux; particularly with beer ?He has not needed his PPI ?No fever, chills, nausea, emesis ?Occasional abdominal soreness; no severe pain ?No issues with incisions ?No other complaints  ? ?Vital signs: ?BP (!) 182/136   Pulse 73   Temp 98.5 ?F (36.9 ?C) (Oral)   Ht 6\' 2"  (1.88 m)   Wt 196 lb 9.6 oz (89.2 kg)   SpO2 98%   BMI 25.24 kg/m?   ? ?Physical Exam: ?Constitutional: Well appearing male, NAD ?Abdomen: Soft, non-tender, non-distended, no rebound/guarding ?Skin: Laparoscopic incisions are well healed ? ?Assessment/Plan: ?This is a 61 y.o. male 35 days s/p robotic assisted laparoscopic paraesophageal hernia repair and Nissen Fundoplication  ? ? - Okay to continue to gradually re-introduce diet/foods ? - Okay to continue to hold PPI; can use if needed ? - Begin to slowly restart regular activity ? - FMLA completed ? - He can follow up on as needed basis; He understands to call with questions/concerns ? ?Please note, he did mention history of anal skin tags vs small external hemorrhoids in the past. Has seen Dr Vicente Males for these with previous banding it sounds like. He plans to follow up with him for this. We will be happy to help if needed.  ? ?-- ?Edison Simon, PA-C ?[facility] ?03/10/2022, 2:26 PM ?M-F: 7am - 4pm ? ? ?

## 2022-03-10 NOTE — Patient Instructions (Signed)

## 2022-03-13 ENCOUNTER — Telehealth (HOSPITAL_COMMUNITY): Payer: BC Managed Care – PPO | Admitting: Physician Assistant

## 2022-03-16 NOTE — Progress Notes (Signed)
? ?Virtual Visit via Telephone Note ?[facility] ? ?I connected with Troy [name]  on 03/17/2022 at 2:58 PM by telephone and verified that I am speaking with the correct person using two identifiers. ? ?Location: ?Patient: Home ?Provider: [facility] ?  ?I discussed the limitations, risks, security and privacy concerns of performing an evaluation and management service by telephone and the availability of in person appointments. I also discussed with the patient that there may be a patient responsible charge related to this service. The patient expressed understanding and agreed to proceed. ? ? ?REASON FOR VISIT:  ?Follow-up for iron deficiency anemia from chronic GI blood loss ? ?CURRENT THERAPY: Intermittent IV iron ? ?INTERVAL HISTORY:  ?Troy [name] 61 y.o. male returns for routine follow-up of his iron deficiency anemia.  He was last evaluated via telemedicine visit by Rojelio Brenner PA-C on 11/28/2021. ? ?At today's visit, he reports feeling fairly well.   ?He had surgical repair of paraesophageal hernia on 02/03/2022. ?Prior to his paraesophageal hernia surgery, he was taking oral iron, vitamin D, and vitamin B12.  He has temporarily stopped after his paraesophageal hernia repair, since he "wanted to let his get get back to normal." ? ?He denies any overt signs of GI blood loss such as melena, hematochezia, or hemoptysis.   ?He noted some scant rectal bleeding after straining for a bowel movement.  He has some occasional issues with constipation alternating with diarrhea.  Reports that his energy levels have improved.  He denies any pica, restless legs, headaches, chest pain, dyspnea on exertion, lightheadedness, or syncope.   ? ?He has 75% energy and 75% appetite. He endorses that he is maintaining a stable weight. ? ?  ?OBSERVATIONS/OBJECTIVE: ?Review of Systems  ?Constitutional:  Positive for malaise/fatigue (fatigue is improving). Negative for chills, diaphoresis and fever.   ?HENT:  Negative for nosebleeds.   ?Respiratory:  Negative for cough, hemoptysis and shortness of breath.   ?Cardiovascular:  Negative for chest pain and palpitations.  ?Gastrointestinal:  Negative for abdominal pain, blood in stool, constipation, diarrhea, nausea and vomiting.  ?Genitourinary:  Negative for hematuria.  ?Skin: Negative.   ?Neurological:  Negative for dizziness and headaches.  ?Endo/Heme/Allergies:  Does not bruise/bleed easily.   ? ?PHYSICAL EXAM (per limitations of virtual telephone visit): The patient is alert and oriented x 3, exhibiting adequate mentation, good mood, and ability to speak in full sentences and execute sound judgement. ? ? ?ASSESSMENT & PLAN: ?1.  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  He has temporarily stopped after his paraesophageal hernia repair, since he "wanted to let his get get back to normal." ?- Symptoms have resolved after most recent IV iron.  Reports resolution of fatigue, dyspnea on exertion, pica, and leg cramps.   ?- He denies any overt signs of blood loss such as bright red blood per rectum or melena .     ?- Most recent labs (03/06/2022): Hgb 15.0/MCV 90.2, ferritin 41, iron saturation 28% ?- CT coronary imaging (10/16/2021) shows very large hiatal hernia, which I suspect is contributing to chronic GI blood loss.  Hopefully, we will start to see if this improve now  that he has had hernia repair as of 02/03/2022. ?- PLAN: Anemia has resolved.  Iron levels have improved on oral iron supplementation. ?- Although patient has ferritin < 100, he is currently asymptomatic.  No strong indication for IV iron at this time.   ?- Patient  advised to restart his ferrous sulfate 325 mg daily with stool softener. ?- We will repeat iron panel and CBC in 4 months.  Office visit after labs. ? ?2.  Vitamin D deficiency: ?- Prior to his paraesophageal hernia surgery, he was taking oral iron, vitamin D, and vitamin B12.  He has temporarily stopped after his paraesophageal hernia repair, since he "wanted to let his get get back to normal." ?- Most recent vitamin D (03/06/2022) is normal at 36.38 ?- PLAN: Patient advised to restart his vitamin D 50,000 weekly as prescribed ?  ?3.  Vitamin B12 deficiency: ?- Most recent B12 (03/06/2022) marginal at 189, with methylmalonic acid 201 (normal) ?- Prior to his paraesophageal hernia surgery, he was taking oral iron, vitamin D, and vitamin B12.  He has temporarily stopped after his paraesophageal hernia repair, since he "wanted to let his get get back to normal." ?- PLAN: Encouraged patient to restart his vitamin B12 (cyanocobalamin) 1000 mcg tablet daily.  We will recheck B12 and methylmalonic acid in 3 to 6 months. ? ? ?I discussed the assessment and treatment plan with the patient. The patient was provided an opportunity to ask questions and all were answered. The patient agreed with the plan and demonstrated an understanding of the instructions. ?  ?The patient was advised to call back or seek an in-person evaluation if the symptoms worsen or if the condition fails to improve as anticipated. ? ?I provided 21 minutes of non-face-to-face time during this encounter. ? ? ?Carnella Guadalajara, PA-C ?03/17/2022 4:36 PM ?

## 2022-03-17 ENCOUNTER — Inpatient Hospital Stay (HOSPITAL_COMMUNITY): Payer: BC Managed Care – PPO | Attending: Hematology | Admitting: Physician Assistant

## 2022-03-17 ENCOUNTER — Encounter (HOSPITAL_COMMUNITY): Payer: Self-pay | Admitting: Physician Assistant

## 2022-03-17 ENCOUNTER — Telehealth: Payer: Self-pay | Admitting: *Deleted

## 2022-03-17 DIAGNOSIS — E559 Vitamin D deficiency, unspecified: Secondary | ICD-10-CM

## 2022-03-17 DIAGNOSIS — E538 Deficiency of other specified B group vitamins: Secondary | ICD-10-CM | POA: Diagnosis not present

## 2022-03-17 DIAGNOSIS — D500 Iron deficiency anemia secondary to blood loss (chronic): Secondary | ICD-10-CM

## 2022-03-17 NOTE — Telephone Encounter (Signed)
Faxed return to work paperwork to [employer] at [phone] ?

## 2022-03-26 NOTE — Progress Notes (Signed)
Work restrictions/Leave forms have been faxed to [employer] at [phone]. Unable to fax to [phone]. ?

## 2022-05-11 ENCOUNTER — Ambulatory Visit: Payer: BC Managed Care – PPO | Admitting: Surgery

## 2022-07-17 ENCOUNTER — Inpatient Hospital Stay: Payer: BC Managed Care – PPO | Attending: Physician Assistant

## 2022-07-21 ENCOUNTER — Other Ambulatory Visit (HOSPITAL_COMMUNITY)
Admission: RE | Admit: 2022-07-21 | Discharge: 2022-07-21 | Disposition: A | Discharge status: Home / Self Care | Payer: BC Managed Care – PPO | Source: Ambulatory Visit | Attending: Physician Assistant | Admitting: Physician Assistant

## 2022-07-21 DIAGNOSIS — D500 Iron deficiency anemia secondary to blood loss (chronic): Secondary | ICD-10-CM

## 2022-07-21 LAB — CBC WITH DIFFERENTIAL/PLATELET
Abs Immature Granulocytes: 0.02 10*3/uL (ref 0.00–0.07)
Basophils Absolute: 0 10*3/uL (ref 0.0–0.1)
Basophils Relative: 1 %
Eosinophils Absolute: 0.2 10*3/uL (ref 0.0–0.5)
Eosinophils Relative: 3 %
HCT: 46.7 % (ref 39.0–52.0)
Hemoglobin: 15.7 g/dL (ref 13.0–17.0)
Immature Granulocytes: 0 %
Lymphocytes Relative: 31 %
Lymphs Abs: 1.7 10*3/uL (ref 0.7–4.0)
MCH: 31.2 pg (ref 26.0–34.0)
MCHC: 33.6 g/dL (ref 30.0–36.0)
MCV: 92.7 fL (ref 80.0–100.0)
Monocytes Absolute: 0.4 10*3/uL (ref 0.1–1.0)
Monocytes Relative: 7 %
Neutro Abs: 3.3 10*3/uL (ref 1.7–7.7)
Neutrophils Relative %: 58 %
Platelets: 240 10*3/uL (ref 150–400)
RBC: 5.04 MIL/uL (ref 4.22–5.81)
RDW: 12.4 % (ref 11.5–15.5)
WBC: 5.7 10*3/uL (ref 4.0–10.5)
nRBC: 0 % (ref 0.0–0.2)

## 2022-07-21 LAB — FERRITIN: Ferritin: 44 ng/mL (ref 24–336)

## 2022-07-21 LAB — IRON AND TIBC
Iron: 142 ug/dL (ref 45–182)
Saturation Ratios: 33 % (ref 17.9–39.5)
TIBC: 434 ug/dL (ref 250–450)
UIBC: 292 ug/dL

## 2022-07-23 NOTE — Progress Notes (Signed)
Virtual Visit via Telephone Note [facility]  I connected with Troy [name]  on 07/24/2022 at 12:40 PM by telephone and verified that I am speaking with the correct person using two identifiers.  Location: Patient: Home Provider: [facility]   I discussed the limitations, risks, security and privacy concerns of performing an evaluation and management service by telephone and the availability of in person appointments. I also discussed with the patient that there may be a patient responsible charge related to this service. The patient expressed understanding and agreed to proceed.  REASON FOR VISIT:  Follow-up for iron deficiency anemia from chronic GI blood loss   CURRENT THERAPY: Intermittent IV iron   INTERVAL HISTORY:  Troy [name] 61 y.o. male returns for routine follow-up of his iron deficiency anemia.  He was last evaluated via telemedicine visit by Rojelio Brenner PA-C on 03/17/2022.    At today's visit, he reports feeling fairly well.  He had surgical repair of paraesophageal hernia on 02/03/2022.  He has had some upset stomach since that time, and has not yet restarted his iron or B12 due to stomach irritation.  He has been taking his vitamin D.  He denies any overt signs of GI blood loss such as melena, hematochezia, or hemoptysis. Reports that his energy levels have improved.  He denies any pica, restless legs, headaches, chest pain, dyspnea on exertion, lightheadedness, or syncope.     He has 80% energy and 100% appetite. He endorses that he is maintaining a stable weight.    OBSERVATIONS/OBJECTIVE: Review of Systems  Constitutional:  Negative for chills, diaphoresis, fever, malaise/fatigue and weight loss.  Respiratory:  Negative for cough and shortness of breath.   Cardiovascular:  Negative for chest pain and palpitations.  Gastrointestinal:  Negative for abdominal pain, blood in stool, melena, nausea and vomiting.  Neurological:  Negative for  dizziness and headaches.     PHYSICAL EXAM (per limitations of virtual telephone visit): The patient is alert and oriented x 3, exhibiting adequate mentation, good mood, and ability to speak in full sentences and execute sound judgement.   ASSESSMENT & PLAN: 1.  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   - S/p surgical repair of large paraesophageal hernia on 02/03/2022 - Most recent IV iron: Venofer 300 mg x 3 doses (09/25/2021-10/15/2021) - He has not been taking iron tablet since hernia repair due to upset stomach - Reports resolution of fatigue, dyspnea on exertion, pica, and leg cramps.   - He denies any overt signs of blood loss such as bright red blood per rectum or melena .     - Most recent labs (07/21/2022): Hgb 15.7/MCV 92.7, ferritin 44, iron saturation 33% - PLAN: Anemia has resolved.  Iron levels have improved on oral iron supplementation. - Although patient has ferritin < 100, he is currently asymptomatic.  No strong indication for IV iron at this time.   - Patient advised to restart his ferrous sulfate 325 mg daily with stool softener, once his indigestion symptoms have improved. - We will repeat iron panel and CBC in 4 months.  Phone visit after labs.   2.  Vitamin D deficiency: - Prior to his paraesophageal hernia surgery, he was taking oral iron, vitamin D, and vitamin B12.  He  has temporarily stopped after his paraesophageal hernia repair, since he "wanted to let his get get back to normal." - Most recent vitamin D (03/06/2022) is normal at 36.38 - PLAN: Patient advised to continue his vitamin D 50,000 weekly as prescribed.  We will recheck vitamin D at follow-up visit in 4 months.   3.  Vitamin B12 deficiency: - Most recent B12 (03/06/2022) marginal at 189,  with methylmalonic acid 201 (normal) - Prior to his paraesophageal hernia surgery, he was taking oral iron, vitamin D, and vitamin B12.  He has temporarily stopped after his paraesophageal hernia repair, since he "wanted to let his get get back to normal." - PLAN: Encouraged patient to restart his vitamin B12 (cyanocobalamin) 1000 mcg tablet daily.  We will recheck B12 and methylmalonic acid in 4 months.   FOLLOW UP INSTRUCTIONS: Labs in 4 months Phone visit 1 week after labs    I discussed the assessment and treatment plan with the patient. The patient was provided an opportunity to ask questions and all were answered. The patient agreed with the plan and demonstrated an understanding of the instructions.   The patient was advised to call back or seek an in-person evaluation if the symptoms worsen or if the condition fails to improve as anticipated.  I provided 18 minutes of non-face-to-face time during this encounter.   Carnella Guadalajara, PA-C 07/24/2022 1:04 PM

## 2022-07-24 ENCOUNTER — Inpatient Hospital Stay (HOSPITAL_BASED_OUTPATIENT_CLINIC_OR_DEPARTMENT_OTHER): Payer: BC Managed Care – PPO | Admitting: Physician Assistant

## 2022-07-24 DIAGNOSIS — E538 Deficiency of other specified B group vitamins: Secondary | ICD-10-CM

## 2022-07-24 DIAGNOSIS — D500 Iron deficiency anemia secondary to blood loss (chronic): Secondary | ICD-10-CM

## 2022-07-24 DIAGNOSIS — E559 Vitamin D deficiency, unspecified: Secondary | ICD-10-CM | POA: Diagnosis not present

## 2022-10-16 IMAGING — RF DG ESOPHAGUS
9 series · 14 of 24 positions shown · non-contrast
Comparison: CT of 01/06/2022.  Endoscopy report of 12/31/2021

CLINICAL DATA: Hiatal hernia.  Burping.

EXAM:
ESOPHAGUS/BARIUM SWALLOW/TABLET STUDY
TECHNIQUE: Single contrast examination was performed using thin liquid barium.
This exam was performed by Carnell Jalomo, pa., and was supervised
and interpreted by Biyaga Buzz, M.D.
FLUOROSCOPY:
Radiation Exposure Index (as provided by the fluoroscopic device):
24.4 mGy Kerma

[Series 1: cp_standard · 0.17mm/px · 1 of 1 slices shown (1 of 9)]
[im 1/1]
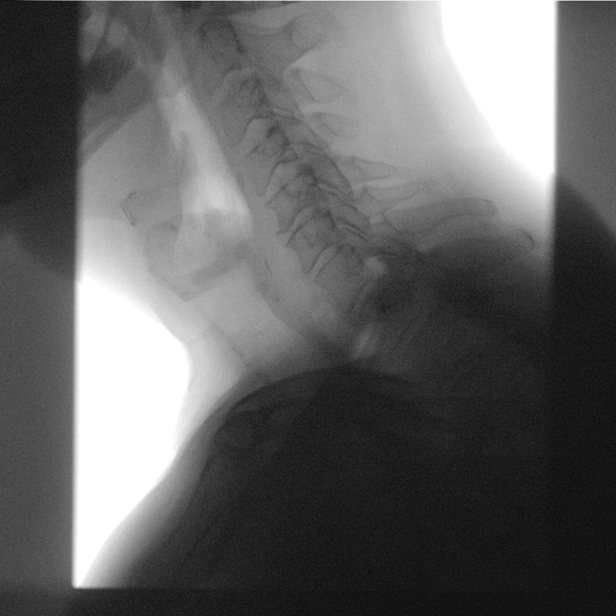

[Series 2: cp_standard · 0.17mm/px · 1 of 54 frames shown (2 of 9)]
[frame 28/54]
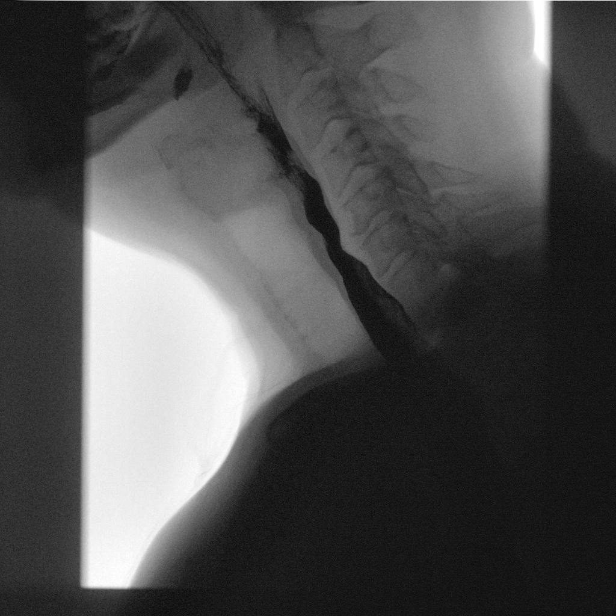

[Series 3: cp_standard · 0.17mm/px · 2 of 46 frames shown (3 of 9)]
[frame 7/46]
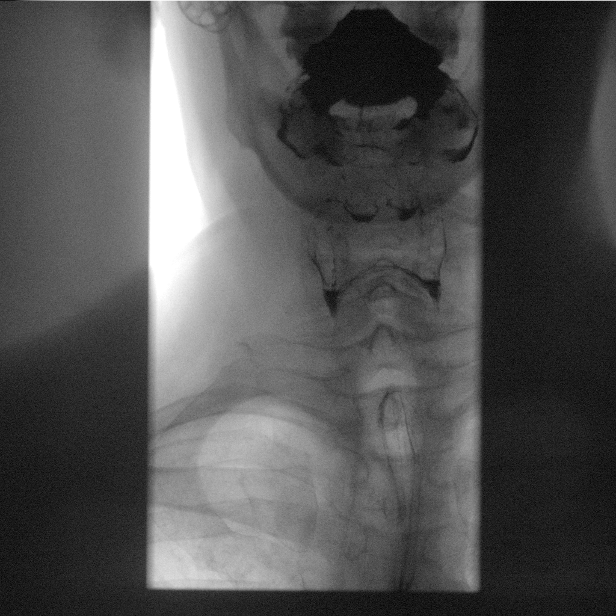
[frame 40/46]
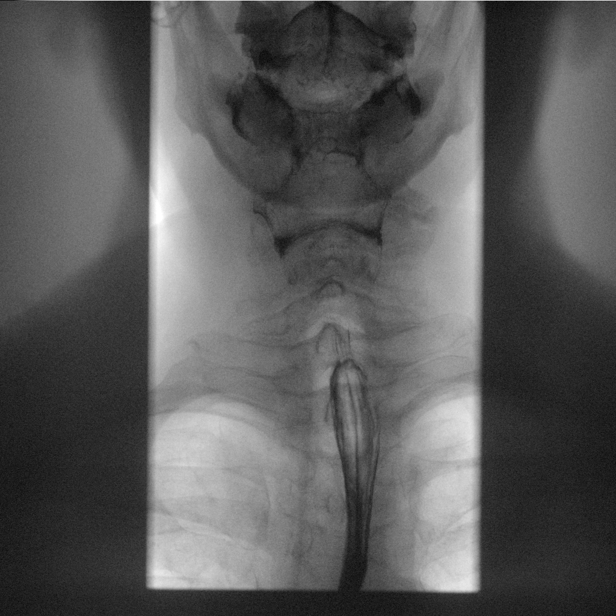

[Series 4: cp_standard · 0.25mm/px · 1 of 104 frames shown (4 of 9)]
[frame 51/104]
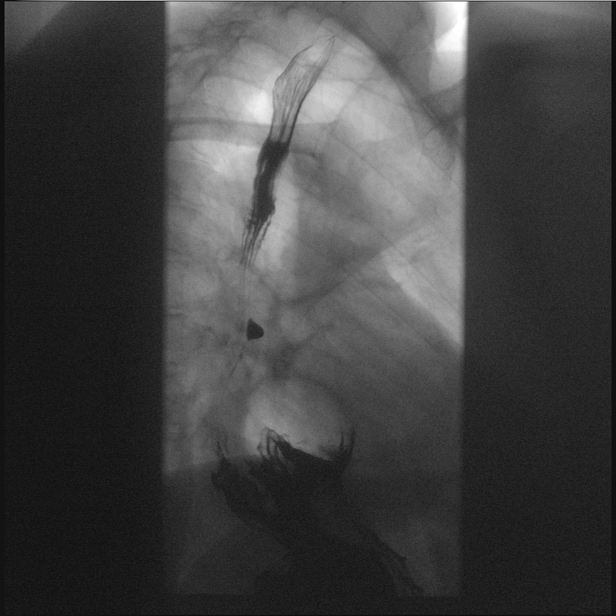

[Series 5: cp_standard · 0.26mm/px · 2 of 160 frames shown (5 of 9)]
[frame 25/160]
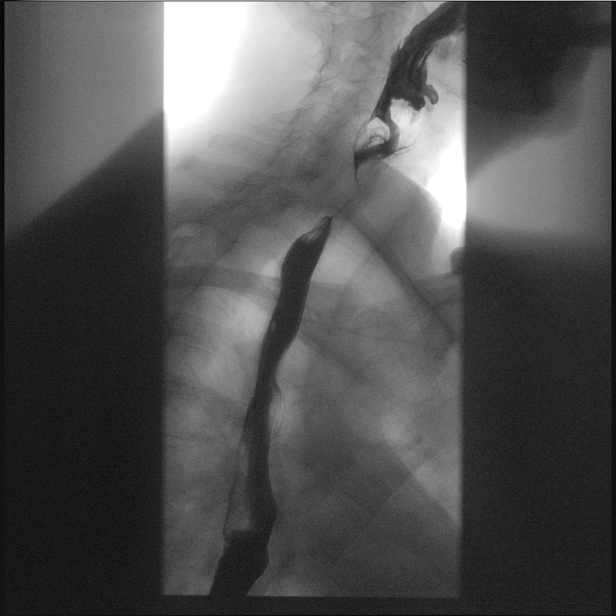
[frame 131/160]
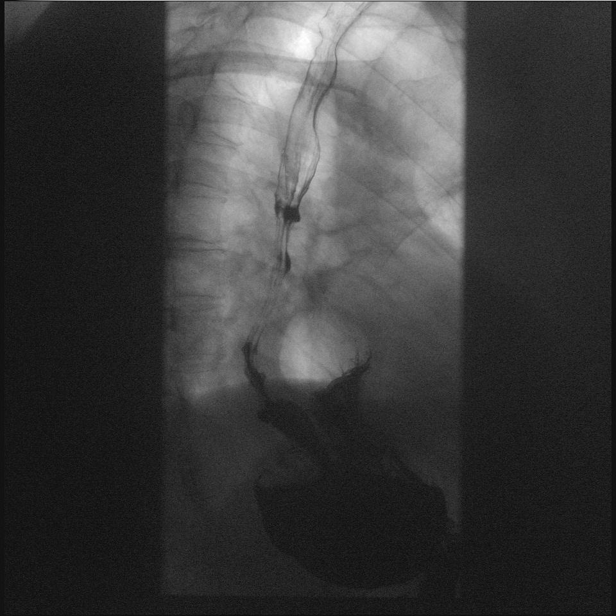

[Series 6: cp_standard · 0.25mm/px · 2 of 106 frames shown (6 of 9)]
[frame 16/106]
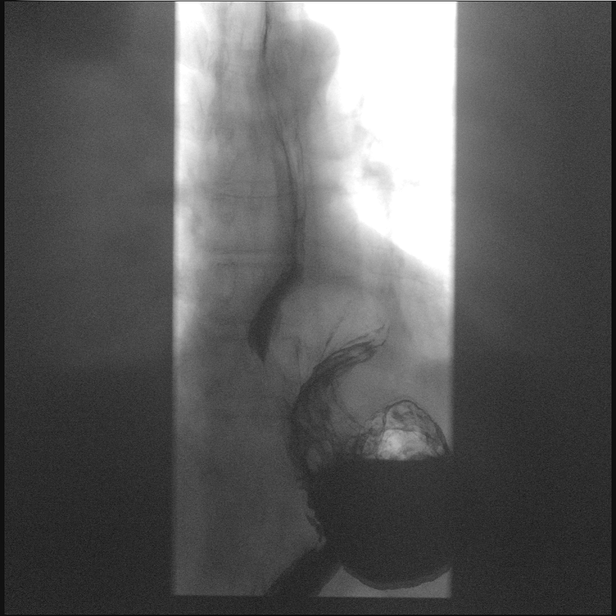
[frame 54/106]
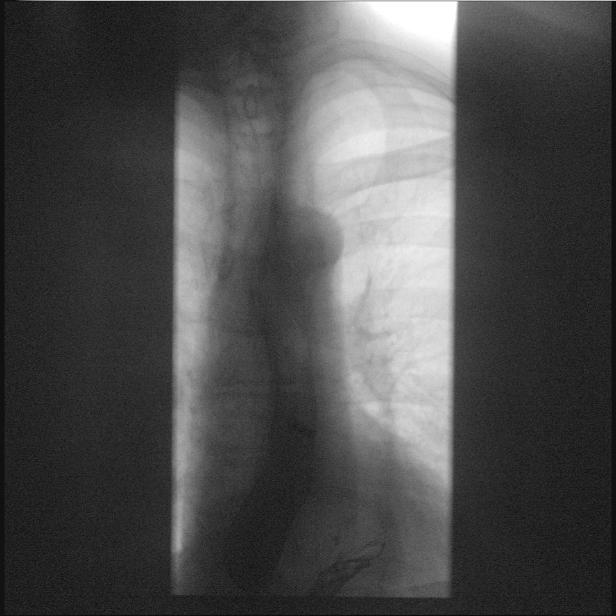

[Series 7: cp_standard · 0.26mm/px · 1 of 143 frames shown (7 of 9)]
[frame 22/143]
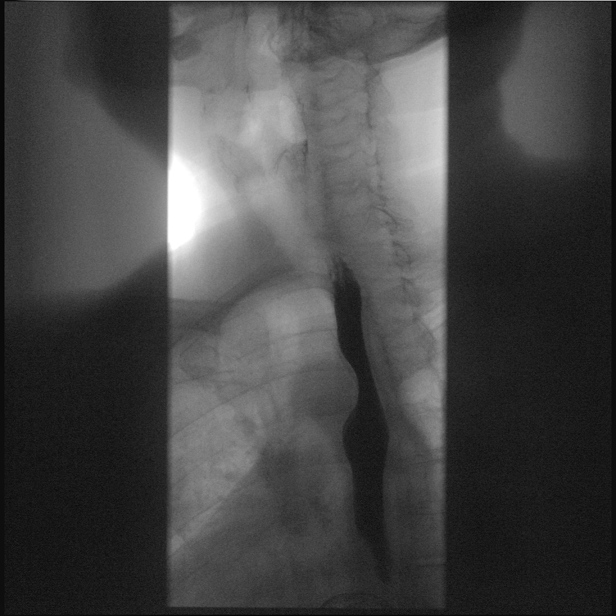

[Series 8: cp_standard · 0.27mm/px · 2 of 92 frames shown (8 of 9)]
[frame 14/92]
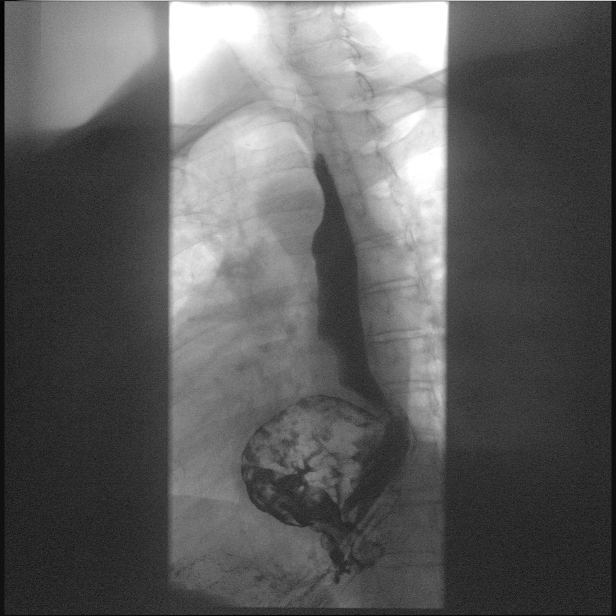
[frame 47/92]
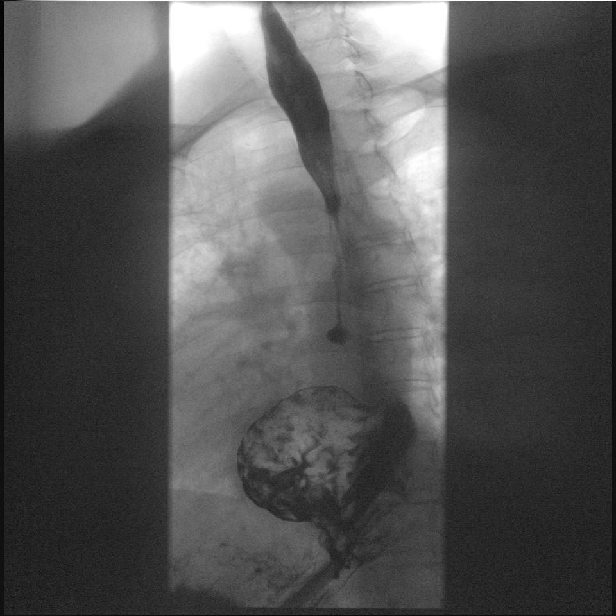

[Series 9: cp_standard · 0.27mm/px · 2 of 192 frames shown (9 of 9)]
[frame 29/192]
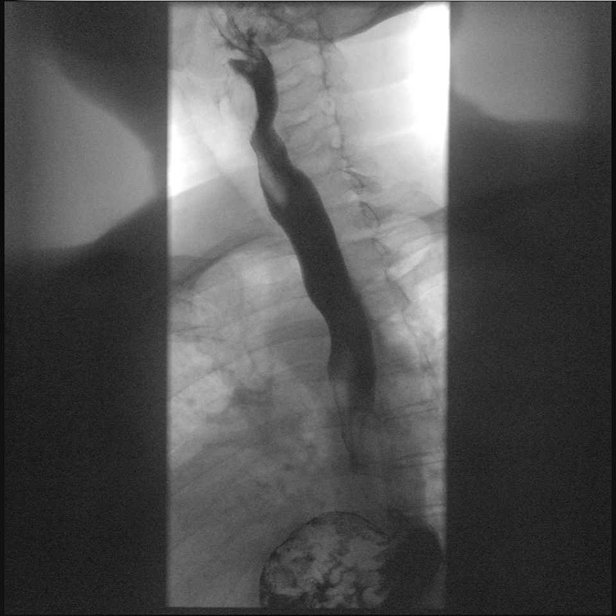
[frame 182/192]
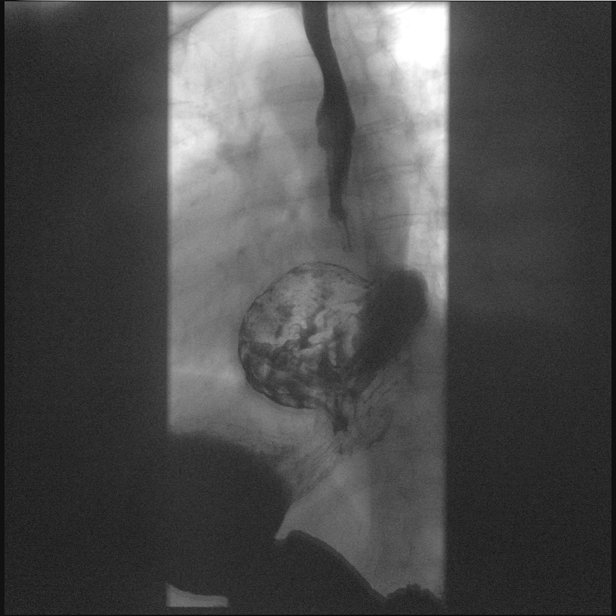

[14 of 24 positions shown; findings below may reference images not displayed]

FINDINGS: Hypopharyngeal portion of the exam is unremarkable.

Evaluation of esophageal motility demonstrates proximal escape waves
and contrast stasis in the upper esophagus.

Full column evaluation of the esophagus demonstrates no persistent
narrowing or stricture. A moderate to large hiatal hernia, with
approximately [DATE] of the stomach positioned in the lower chest.

A 13 mm barium tablet passes promptly.
IMPRESSION: Moderate to large hiatal hernia.

Esophageal dysmotility, likely early presbyesophagus.

## 2022-11-27 ENCOUNTER — Inpatient Hospital Stay: Payer: 59 | Attending: Hematology

## 2022-11-27 ENCOUNTER — Encounter (HOSPITAL_COMMUNITY): Payer: Self-pay | Admitting: Hematology

## 2022-11-27 DIAGNOSIS — D500 Iron deficiency anemia secondary to blood loss (chronic): Secondary | ICD-10-CM | POA: Insufficient documentation

## 2022-11-27 DIAGNOSIS — K5520 Angiodysplasia of colon without hemorrhage: Secondary | ICD-10-CM | POA: Diagnosis present

## 2022-11-27 DIAGNOSIS — E559 Vitamin D deficiency, unspecified: Secondary | ICD-10-CM | POA: Insufficient documentation

## 2022-11-27 DIAGNOSIS — E538 Deficiency of other specified B group vitamins: Secondary | ICD-10-CM | POA: Diagnosis present

## 2022-11-27 LAB — FERRITIN: Ferritin: 62 ng/mL (ref 24–336)

## 2022-11-27 LAB — IRON AND TIBC
Iron: 171 ug/dL (ref 45–182)
Saturation Ratios: 37 % (ref 17.9–39.5)
TIBC: 468 ug/dL — ABNORMAL HIGH (ref 250–450)
UIBC: 297 ug/dL

## 2022-11-27 LAB — CBC WITH DIFFERENTIAL/PLATELET
Abs Immature Granulocytes: 0.01 10*3/uL (ref 0.00–0.07)
Basophils Absolute: 0.1 10*3/uL (ref 0.0–0.1)
Basophils Relative: 1 %
Eosinophils Absolute: 0.3 10*3/uL (ref 0.0–0.5)
Eosinophils Relative: 5 %
HCT: 48.5 % (ref 39.0–52.0)
Hemoglobin: 16.3 g/dL (ref 13.0–17.0)
Immature Granulocytes: 0 %
Lymphocytes Relative: 38 %
Lymphs Abs: 2 10*3/uL (ref 0.7–4.0)
MCH: 31.2 pg (ref 26.0–34.0)
MCHC: 33.6 g/dL (ref 30.0–36.0)
MCV: 92.7 fL (ref 80.0–100.0)
Monocytes Absolute: 0.4 10*3/uL (ref 0.1–1.0)
Monocytes Relative: 8 %
Neutro Abs: 2.5 10*3/uL (ref 1.7–7.7)
Neutrophils Relative %: 48 %
Platelets: 208 10*3/uL (ref 150–400)
RBC: 5.23 MIL/uL (ref 4.22–5.81)
RDW: 11.8 % (ref 11.5–15.5)
WBC: 5.2 10*3/uL (ref 4.0–10.5)
nRBC: 0 % (ref 0.0–0.2)

## 2022-11-27 LAB — VITAMIN B12: Vitamin B-12: 184 pg/mL (ref 180–914)

## 2022-11-27 LAB — VITAMIN D 25 HYDROXY (VIT D DEFICIENCY, FRACTURES): Vit D, 25-Hydroxy: 29.8 ng/mL — ABNORMAL LOW (ref 30–100)

## 2022-12-03 LAB — METHYLMALONIC ACID, SERUM: Methylmalonic Acid, Quantitative: 197 nmol/L (ref 0–378)

## 2022-12-04 ENCOUNTER — Inpatient Hospital Stay (HOSPITAL_BASED_OUTPATIENT_CLINIC_OR_DEPARTMENT_OTHER): Payer: 59 | Admitting: Nurse Practitioner

## 2022-12-04 DIAGNOSIS — D500 Iron deficiency anemia secondary to blood loss (chronic): Secondary | ICD-10-CM

## 2022-12-04 DIAGNOSIS — E559 Vitamin D deficiency, unspecified: Secondary | ICD-10-CM

## 2022-12-04 NOTE — Progress Notes (Signed)
Patient did not answer for virtual visit. VM left to have him contact clinic to reschedule.

## 2024-06-29 ENCOUNTER — Other Ambulatory Visit: Payer: Self-pay | Admitting: *Deleted
